# Patient Record
Sex: Male | Born: 1990
Health system: Southern US, Community
[De-identification: ages and names within clinical notes are randomized; demographics above are authoritative.]

## PROBLEM LIST (undated history)

## (undated) DIAGNOSIS — F1910 Other psychoactive substance abuse, uncomplicated: Secondary | ICD-10-CM

## (undated) DIAGNOSIS — F840 Autistic disorder: Secondary | ICD-10-CM

## (undated) DIAGNOSIS — R4183 Borderline intellectual functioning: Secondary | ICD-10-CM

## (undated) DIAGNOSIS — F250 Schizoaffective disorder, bipolar type: Secondary | ICD-10-CM

## (undated) DIAGNOSIS — F259 Schizoaffective disorder, unspecified: Secondary | ICD-10-CM

## (undated) DIAGNOSIS — F848 Other pervasive developmental disorders: Secondary | ICD-10-CM

## (undated) DIAGNOSIS — F1010 Alcohol abuse, uncomplicated: Secondary | ICD-10-CM

## (undated) DIAGNOSIS — R569 Unspecified convulsions: Secondary | ICD-10-CM

## (undated) HISTORY — PX: CIRCUMCISION: SUR203

## (undated) HISTORY — DX: Other pervasive developmental disorders: F84.8

---
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
## 2011-04-02 LAB — RAPID URINE DRUG SCREEN, HOSP PERFORMED
Amphetamines: NOT DETECTED
Tetrahydrocannabinol: NOT DETECTED

## 2012-02-12 ENCOUNTER — Emergency Department (HOSPITAL_BASED_OUTPATIENT_CLINIC_OR_DEPARTMENT_OTHER)
Admission: EM | Admit: 2012-02-12 | Discharge: 2012-02-12 | Disposition: A | Discharge status: Home / Self Care | Payer: Medicaid Other | Attending: Emergency Medicine | Admitting: Emergency Medicine

## 2012-02-12 ENCOUNTER — Emergency Department (HOSPITAL_BASED_OUTPATIENT_CLINIC_OR_DEPARTMENT_OTHER): Payer: Medicaid Other

## 2012-02-12 ENCOUNTER — Encounter (HOSPITAL_BASED_OUTPATIENT_CLINIC_OR_DEPARTMENT_OTHER): Payer: Self-pay

## 2012-02-12 DIAGNOSIS — R221 Localized swelling, mass and lump, neck: Secondary | ICD-10-CM | POA: Insufficient documentation

## 2012-02-12 DIAGNOSIS — F840 Autistic disorder: Secondary | ICD-10-CM | POA: Insufficient documentation

## 2012-02-12 DIAGNOSIS — G40909 Epilepsy, unspecified, not intractable, without status epilepticus: Secondary | ICD-10-CM | POA: Insufficient documentation

## 2012-02-12 DIAGNOSIS — S01501A Unspecified open wound of lip, initial encounter: Secondary | ICD-10-CM | POA: Insufficient documentation

## 2012-02-12 DIAGNOSIS — R220 Localized swelling, mass and lump, head: Secondary | ICD-10-CM | POA: Insufficient documentation

## 2012-02-12 DIAGNOSIS — R569 Unspecified convulsions: Secondary | ICD-10-CM

## 2012-02-12 DIAGNOSIS — R296 Repeated falls: Secondary | ICD-10-CM | POA: Insufficient documentation

## 2012-02-12 HISTORY — DX: Autistic disorder: F84.0

## 2012-02-12 HISTORY — DX: Unspecified convulsions: R56.9

## 2012-02-12 MED ORDER — TETANUS-DIPHTHERIA TOXOIDS TD 5-2 LFU IM INJ
INJECTION | INTRAMUSCULAR | Status: AC
  Administered 2012-02-12: 0.5 mL via INTRAMUSCULAR
  Filled 2012-02-12: qty 0.5

## 2012-02-12 MED ORDER — TETANUS-DIPHTH-ACELL PERTUSSIS 5-2.5-18.5 LF-MCG/0.5 IM SUSP
0.5000 mL | Freq: Once | INTRAMUSCULAR | Status: DC
  Filled 2012-02-12: qty 0.5

## 2012-02-12 NOTE — ED Notes (Signed)
CBG PTA 97  BP 140/83 HR 72

## 2012-02-12 NOTE — ED Notes (Signed)
Witnessed seizure PTA and pt sustained a lip laceration.

## 2012-02-12 NOTE — ED Notes (Signed)
Father refused tdap for pt.  Did not want him to have the pertussis

## 2012-02-12 NOTE — ED Provider Notes (Signed)
History     CSN: [id number]  Arrival date & time 02/12/12  1226   First MD Initiated Contact with Patient 02/12/12 1254      Chief Complaint  Patient presents with  . Seizures  . Lip Laceration    (Consider location/radiation/quality/duration/timing/severity/associated sxs/prior treatment) HPI Comments: Patient with a history of autism and seizures presents with seizure that was witnessed by his brother. The seizure lasted approximately 5 minutes and he had a postictal state lasts about 15 minutes. He has a history of grand mal seizures and his last seizure was in April when that time to increase his Lamictal. The seizure was similar to his past seizures per the family. He had tonic-clonic activity of all extremities. He had fallen in to a door frame and his face in the door frame. He complains of facial pain and a laceration to his lip but denies any other injuries denies any neck or back pain. Denies any numbness or weakness of his extremities  Patient is a 21 y.o. male presenting with seizures. The history is provided by the patient.  Seizures  Pertinent negatives include no headaches, no speech difficulty, no chest pain, no cough, no nausea, no vomiting and no diarrhea.    Past Medical History  Diagnosis Date  . Autism   . Seizure     No past surgical history on file.  No family history on file.  History  Substance Use Topics  . Smoking status: Never Smoker   . Smokeless tobacco: Never Used  . Alcohol Use: No      Review of Systems  Constitutional: Negative for fever, chills, diaphoresis and fatigue.  HENT: Positive for facial swelling. Negative for congestion, rhinorrhea, sneezing and neck pain.   Eyes: Negative.   Respiratory: Negative for cough, chest tightness and shortness of breath.   Cardiovascular: Negative for chest pain and leg swelling.  Gastrointestinal: Negative for nausea, vomiting, abdominal pain, diarrhea and blood in stool.  Genitourinary: Negative  for frequency, hematuria, flank pain and difficulty urinating.  Musculoskeletal: Negative for back pain and arthralgias.  Skin: Positive for wound. Negative for rash.  Neurological: Positive for seizures. Negative for dizziness, speech difficulty, weakness, numbness and headaches.    Allergies  Review of patient's allergies indicates no known allergies.  Home Medications   Current Outpatient Rx  Name Route Sig Dispense Refill  . LAMOTRIGINE 100 MG PO TABS Oral Take 100 mg by mouth daily.      BP 124/49  Pulse 72  Temp 98 F (36.7 C) (Oral)  Resp 18  SpO2 100%  Physical Exam  Constitutional: He is oriented to person, place, and time. He appears well-developed and well-nourished.  HENT:  Head: Normocephalic and atraumatic.  Mouth/Throat: Oropharynx is clear and moist.       Once the major laceration to the lower lip the majority on the inner mucosa of the lip and extends just slightly to the outer aspect of the lip. Edges are well approximated and there is no active bleeding. Nares are intact with no septal hematomas there is some dried present in the nares TMs are clear bilaterally. Moderate swelling and tenderness over the right zygomatic arch  Eyes: Conjunctivae and EOM are normal. Pupils are equal, round, and reactive to light.  Neck: Normal range of motion. Neck supple.  Cardiovascular: Normal rate, regular rhythm and normal heart sounds.   Pulmonary/Chest: Effort normal and breath sounds normal. No respiratory distress. He has no wheezes. He has no rales. He  exhibits no tenderness.  Abdominal: Soft. Bowel sounds are normal. There is no tenderness. There is no rebound and no guarding.  Musculoskeletal: Normal range of motion. He exhibits no edema.       No pain to neck or back  Lymphadenopathy:    He has no cervical adenopathy.  Neurological: He is alert and oriented to person, place, and time. He has normal strength. No cranial nerve deficit or sensory deficit. GCS eye  subscore is 4. GCS verbal subscore is 5. GCS motor subscore is 6.  Skin: Skin is warm and dry. No rash noted.  Psychiatric: He has a normal mood and affect.    ED Course  Procedures (including critical care time) Results for orders placed during the hospital encounter of 09/08/07  URINE RAPID DRUG SCREEN (HOSP PERFORMED)      Component Value Range   Opiates NONE DETECTED     Cocaine NONE DETECTED     Benzodiazepines NONE DETECTED     Amphetamines NONE DETECTED     Tetrahydrocannabinol NONE DETECTED     Barbiturates       Value: NONE DETECTED            DRUG SCREEN FOR MEDICAL PURPOSES     ONLY.  IF CONFIRMATION IS NEEDED     FOR ANY PURPOSE, NOTIFY LAB     WITHIN 5 DAYS.   Ct Head Wo Contrast  02/12/2012  *RADIOLOGY REPORT*  Clinical Data:  Seizure.  Injury to right side of face with swelling and laceration.  CT HEAD WITHOUT CONTRAST CT MAXILLOFACIAL WITHOUT CONTRAST  Technique:  Multidetector CT imaging of the head and maxillofacial structures were performed using the standard protocol without intravenous contrast. Multiplanar CT image reconstructions of the maxillofacial structures were also generated.  Comparison:  04/14/2010  CT HEAD  Findings: The brain stem, cerebellum, cerebral peduncles, thalami, basal ganglia, basilar cisterns, and ventricular system appear unremarkable.  No intracranial hemorrhage, mass lesion, or acute infarction is identified.  Minimal chronic right sphenoid sinusitis noted.  IMPRESSION:  1.  Minimal chronic right sphenoid sinusitis.   Otherwise, no significant abnormality identified.  CT MAXILLOFACIAL  Findings:   Minimal chronic right sphenoid sinusitis is observed. Remaining paranasal sinuses appear clear.  No facial fracture identified.  The orbits appear unremarkable. Minimal right facial soft tissue swelling noted.  IMPRESSION:  1.  Minimal right facial subcutaneous soft tissue swelling.  No underlying bony fracture observed. 2.  Mild chronic right sphenoid  sinusitis.  Original Report Authenticated By: Dellia Cloud, M.D.   Ct Maxillofacial Wo Cm  02/12/2012  *RADIOLOGY REPORT*  Clinical Data:  Seizure.  Injury to right side of face with swelling and laceration.  CT HEAD WITHOUT CONTRAST CT MAXILLOFACIAL WITHOUT CONTRAST  Technique:  Multidetector CT imaging of the head and maxillofacial structures were performed using the standard protocol without intravenous contrast. Multiplanar CT image reconstructions of the maxillofacial structures were also generated.  Comparison:  04/14/2010  CT HEAD  Findings: The brain stem, cerebellum, cerebral peduncles, thalami, basal ganglia, basilar cisterns, and ventricular system appear unremarkable.  No intracranial hemorrhage, mass lesion, or acute infarction is identified.  Minimal chronic right sphenoid sinusitis noted.  IMPRESSION:  1.  Minimal chronic right sphenoid sinusitis.   Otherwise, no significant abnormality identified.  CT MAXILLOFACIAL  Findings:   Minimal chronic right sphenoid sinusitis is observed. Remaining paranasal sinuses appear clear.  No facial fracture identified.  The orbits appear unremarkable. Minimal right facial soft tissue swelling noted.  IMPRESSION:  1.  Minimal right facial subcutaneous soft tissue swelling.  No underlying bony fracture observed. 2.  Mild chronic right sphenoid sinusitis.  Original Report Authenticated By: Dellia Cloud, M.D.      1. Seizure       MDM  Patient with no further seizure activity. No evidence of intracranial hemorrhage or facial fractures. I did discuss the findings with the father and offered to contact the patient's neurologist however is the father says that he start a got a call out to his neurologist to see if they want to make any changes to his medications and he did not want to stay any longer here in the emergency department by contact the neurologist. He states that he will handle it himself. Advised patient and his father to followup  with the neurologist as to his possible return here as needed for any worsening symptoms        Rolan Bucco, MD 02/12/12 1430

## 2012-11-04 ENCOUNTER — Telehealth: Payer: Self-pay

## 2012-11-04 NOTE — Telephone Encounter (Signed)
Douglas Levine, Douglas Levine called me back. He said that Douglas Levine had not had a seizure since August. He asked if he could resume riding his bike along a primary road to a nearby shopping center. He said that Douglas Levine was able to ride a bike safely but that he did sometimes become excited and like to ride fast, sometimes to see how fast he could go. When he did so, sometimes the excitement would trigger a seizure. I talked to Douglas Levine about my concerns about this since [name] would be along a primary road. I don't mind Douglas Levine riding a bike in a normal, safe fashion, while wearing a bike helmet, away from traffic but if he is doing things in a way that could trigger a seizure in traffic, I cannot agree to him riding his bike along a primary road. Douglas Levine agreed with this and said that he would think about what to do.

## 2012-11-04 NOTE — Telephone Encounter (Signed)
I left a message for Dad, Muhammad Vacca and asked him to return my call. TG

## 2012-11-04 NOTE — Telephone Encounter (Addendum)
Robert lvm asking for Inetta Fermo to call him so that he can discuss with her child's abilities since his last seizure was so long ago. i called him back and reached his vm. I left a message asking him to call me back so that I can obtain more information. Molly Maduro returned my call and said that he prefer to speak w Inetta Fermo but that one of the main things he would like to know if it is ok for pt to ride his bike now? I told him that Inetta Fermo was w pts and would return his call sometime later today. He expressed understanding.

## 2012-12-16 ENCOUNTER — Telehealth: Payer: Self-pay | Admitting: *Deleted

## 2012-12-16 NOTE — Telephone Encounter (Signed)
Robert the patient's dad called and stated that at 7:45 am this morning the patient had a grand mal seizure while in the shower, he fell in a sitting up position with his back against the tub, he suffered some long scratches on his back. Dad did not witness the seizure he was at work and was called by his other son, he rushed home and he says that Winter is now resting in his father's bed. Molly Maduro was unsure how long the seizure lasted his son who witnessed the seizure was not there when I called to speak with dad, the patient also has taken his regular 9:00 am medication Lamotrigine 100 mg. Molly Maduro can be reached at [phone] to discuss this matter he has questions about the patient's medication and if it should possibly be increased.     Thanks,  Belenda Cruise.

## 2012-12-16 NOTE — Telephone Encounter (Signed)
I called and spoke to Dad. He said that Nyshaun went to bed at 10 PM last night. He got up at 7:30 AM, which is 2 hrs earlier than his usual, but he had an appointment at [facility] for job interview training. While in the shower at 7:45 AM, he had a seizure and fell. His brother Reita Cliche heard him and helped him but it is not clear how long the seizure lasted. He has some scrapes on his back but was not otherwise injured. He has not missed any doses of medication. I told Dad to increase his Lamotrigine dose to Lamotrigine 100mg , 3 tablets BID. His last drug level was 7.37mcg/ml in September 2013. Lewi is due to follow up this summer and I will check with scheduler to make sure that he gets an appointment. Dad agreed with this plan.

## 2013-01-13 DIAGNOSIS — F848 Other pervasive developmental disorders: Secondary | ICD-10-CM | POA: Insufficient documentation

## 2013-01-13 DIAGNOSIS — G40309 Generalized idiopathic epilepsy and epileptic syndromes, not intractable, without status epilepticus: Secondary | ICD-10-CM | POA: Insufficient documentation

## 2013-01-13 DIAGNOSIS — Z79899 Other long term (current) drug therapy: Secondary | ICD-10-CM

## 2013-02-05 ENCOUNTER — Ambulatory Visit: Payer: Self-pay | Admitting: Pediatrics

## 2013-02-12 ENCOUNTER — Ambulatory Visit: Payer: Self-pay | Admitting: Pediatrics

## 2013-03-04 ENCOUNTER — Other Ambulatory Visit: Payer: Self-pay | Admitting: Family

## 2013-03-30 ENCOUNTER — Other Ambulatory Visit: Payer: Self-pay | Admitting: Family

## 2013-04-29 ENCOUNTER — Other Ambulatory Visit: Payer: Self-pay | Admitting: Family

## 2013-04-29 DIAGNOSIS — G40909 Epilepsy, unspecified, not intractable, without status epilepticus: Secondary | ICD-10-CM

## 2013-05-26 ENCOUNTER — Other Ambulatory Visit: Payer: Self-pay | Admitting: Neurology

## 2013-06-11 ENCOUNTER — Telehealth: Payer: Self-pay

## 2013-06-11 NOTE — Telephone Encounter (Signed)
Robert, dad, lvm stating that pt had a sz on 06/04/13. He said that it was due to pt missing his medication dose. He also stated that he made a f/u appt for 08/18/13. He said that he is faxing over a transportation form,[employer], and asked that Inetta Fermo fill it out and mail it back to him. He said that the directions would be on the cover sheet. I called Molly Maduro back and he said that he had been putting up reminders in certain areas of the house for pt to remember to take his medications. It was working out well for [employer]. Pts had a routine where he would set his alarm for 9 am every morning to wake him up. Then, he would get ready for work and take his medication. The [employer] bus would come to the house around 10-10:30 am to pick him up for work. For the past month, pt was not following the routine. He would have the [employer] bus pull in the driveway and call him. He would use this phone call as an alarm. Then, he would get up and rush to get ready for work. Dad said that he thinks pt was forgetting to take his meds bc he was rushing to get out the door. Pt would also fill his medicine box every Sunday. Dad said that he forgot to do this and ended up missing his doses from Sunday 05/31/13 -Thurs. 06/04/13. Thus, causing a sz to occur.   As for the SCAT forms, dad said that last time he had them filled out by our office was in 2011. They are only due every few years. He said that he thinks that he had to have our office and the pt's PCP at the time both fill them out bc of the Autism dx. He wants to know who that doctor was that filled it out the last time bc he can't remember. He said that he may need to see if that doctor will do it again. Pt is getting ready to go to a new PCP bc he now has Medicaid.I told him that his new PCP might be willing to fill it out and that he should speak w him about it as well. I explained that I am unsure if we would have that info or not and that our system was down so I am unable to look at this  time. He wants a call back when we can look it up. Molly Maduro can be reached at [phone].

## 2013-06-11 NOTE — Telephone Encounter (Signed)
I called dad back and let him know that we did not receive the forms yet. He faxed them again and I received them. He asked if they would be finished in 3 business days. I told him yes unless there is a problem. At that time, we will contact him.

## 2013-06-12 NOTE — Telephone Encounter (Signed)
Robert called and lvm stating that he is still waiting on the call back and will be available at any time. Please call him at [phone].

## 2013-06-12 NOTE — Telephone Encounter (Signed)
I called Dad and obtained information needed to complete form. I will mail it to him on Monday. TG

## 2013-06-12 NOTE — Telephone Encounter (Signed)
Molly Maduro called and said that he received a vm asking him to call back this morning regarding the paperwork. He said that he can go through the paperwork with the provider over the phone and help answer questions. Molly Maduro said that he will be available all day at [phone].

## 2013-06-15 ENCOUNTER — Other Ambulatory Visit: Payer: Self-pay | Admitting: Family

## 2013-08-17 ENCOUNTER — Other Ambulatory Visit: Payer: Self-pay | Admitting: Otolaryngology

## 2013-08-17 DIAGNOSIS — R599 Enlarged lymph nodes, unspecified: Secondary | ICD-10-CM

## 2013-08-18 ENCOUNTER — Ambulatory Visit (INDEPENDENT_AMBULATORY_CARE_PROVIDER_SITE_OTHER): Payer: Medicaid Other | Admitting: Pediatrics

## 2013-08-18 ENCOUNTER — Encounter: Payer: Self-pay | Admitting: Pediatrics

## 2013-08-18 VITALS — BP 120/50 | HR 72 | Ht 73.25 in | Wt 167.8 lb

## 2013-08-18 DIAGNOSIS — G40309 Generalized idiopathic epilepsy and epileptic syndromes, not intractable, without status epilepticus: Secondary | ICD-10-CM

## 2013-08-18 DIAGNOSIS — F840 Autistic disorder: Secondary | ICD-10-CM

## 2013-08-18 DIAGNOSIS — Z91199 Patient's noncompliance with other medical treatment and regimen due to unspecified reason: Secondary | ICD-10-CM

## 2013-08-18 DIAGNOSIS — Z9119 Patient's noncompliance with other medical treatment and regimen: Secondary | ICD-10-CM

## 2013-08-18 MED ORDER — LAMOTRIGINE 100 MG PO TABS: ORAL_TABLET | ORAL | Status: DC

## 2013-08-18 NOTE — Progress Notes (Signed)
Patient: Douglas Levine MRN: [id number] Sex: male DOB: 22-May-1991  Provider: Deetta Perla, MD Location of Care: [facility]  Note type: Routine return visit  History of Present Illness: Referral Source: [facility] History from: patient, [facility] chart and father was present by telephone. Chief Complaint: Epilepsy  Douglas Levine is a 23 y.o. male who returns for evaluation and management of juvenile absence epilepsy, borderline cognitive function, and autism spectrum disorder.  The patient returns August 18, 2013, for the first time since July 16, 2012.  He has juvenile absence epilepsy, borderline cognitive function.  Since his last visit in January, he had a seizure on the morning of December 16, 2012.  He fell in the shower.  Fortunately, he did not hit his head.  Fortunately, his brother was at home and contacted his father who came home.  Lamotrigine was increased to 300 mg twice daily.  The next episode happened on June 04, 2013.  The patient had been noncompliant with his medications from Sunday the 23rd through Thursday the 27th.  The patient had forgotten to fill his medication box and his father had not got the problem until the patient had a seizure.    The last episode happened on August 15, 2013, in the morning.  The patient was going to the circus and state up late the night before.  The seizure lasted for about 40 seconds and he was postictal for about four minutes.  Thereafter, he was tired.  His father discovered that the patient is sporadically taking his pills.  His work schedule forces him to be at work at 9 o'clock and he has not adjusted yet to remembering to take his morning dose and his nighttime dose on a regular basis.  I suggested that he might set an alarm.  His father felt that he would not do this in a consistent way and relying on an alarm would prove to be just as inadequate.  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  He is there between 9 a.m. and 3:45 p.m.  He takes the [employer] Bus to and from work at his home by himself for a couple of hours.  He goes to bed between 10 and 10:30.  He falls asleep fairly quickly.  Has occasional arousals at nighttime, but generally sleeps fairly soundly until 7.  He may be up as late as midnight on the weekends, but usually sleeps late.  Overall, his health has been good.  His father was "present" during the history taking by phone.  Review of Systems: 12 system review was unremarkable  Past Medical History  Diagnosis Date  . Autism   . Seizure   . Other specified pervasive developmental disorders, current or active state    Hospitalizations: no, Head Injury: no, Nervous System Infections: no, Immunizations up to date: yes Past Medical History Comments: He had MRI and EEG in [place] before the family moved to this area.  On evaluation December 10, 1995 he was noted to have a behavior disorder, and developmental delay.  He had a nonfocal neurological examination. EEG performed On May 17, 2006 showed episodes of 6-10 seconds of 2-1/2 Hz 400 V spike and slow-wave activity was regularly contoured.  He was poorly responsive during this time.  He also had frontally predominant Isolated spike and slow-wave discharges.  Background was otherwise normal.  He was initially placed on Zonegran on May 23, 2006.  Birth History Adopted in [birth year]  6 lbs. 6 oz. Infant born following an uncomplicated pregnancy except mother received no prenatal care. She will is a 22 year old primigravida.  Gestation was complicated by nausea and the first trimester.  The patient had limited use of alcohol no use of cigarettes or drugs during pregnancy.  In the nursery he had a poor suck, and jaundice treated with the phototherapy.  He remained for 5 days. The patient wanted 13-15 months, talk to 3 years and became toilet trained by about 4.   Behavior History  He is always behind socially.  He  had problems with impulsivity, episodic temper tantrums, physical aggression, and self-injurious behavior.  He also was aggressive with his sister and his mother.  Surgical History No past surgical history on file.  Family History family history includes Alcohol abuse in his father; Drug abuse in his father; Other in his mother.Mother was in special education and had learning differences.  Her twin brother was a Forensic scientist major at the [employer] of [place].  The patient's father was destructive as a child and abused recreational drugs as an adult. Family History is negative migraines, seizures, cognitive impairment, blindness, deafness, birth defects, chromosomal disorder, autism.  Social History History   Social History  . Marital Status: Single    Spouse Name: N/A    Number of Children: N/A  . Years of Education: N/A   Social History Main Topics  . Smoking status: Current Some Day Smoker -- 0.50 packs/day  . Smokeless tobacco: Never Used  . Alcohol Use: No  . Drug Use: No  . Sexual Activity: No   Other Topics Concern  . None   Social History Narrative  . None   Educational level 12th grade School Occupation: Works in [employer] at [employer].  Living with father  Hobbies/Interest: Enjoys being on his computer and playing football with his family. School comments Darrelle graduated from [employer] in 2011 with a certificate.   Current Outpatient Prescriptions on File Prior to Visit  Medication Sig Dispense Refill  . lamoTRIgine (LAMICTAL) 100 MG tablet TAKE TWO TABLETS BY MOUTH IN THE MORNING AND TAKE THREE TABLETS BY MOUTH IN THE EVENING  150 tablet  2   No current facility-administered medications on file prior to visit.   The medication list was reviewed and reconciled. All changes or newly prescribed medications were explained.  A complete medication list was provided to the patient/caregiver.  No Known Allergies  Physical Exam BP 120/50  Pulse 72  Ht  6' 1.25" (1.861 m)  Wt 167 lb 12.8 oz (76.114 kg)  BMI 21.98 kg/m2  General: alert, well developed, well nourished, in no acute distress, blond hair, blue eyes, left handed Head: normocephalic, no dysmorphic features Ears, Nose and Throat: Otoscopic: Tympanic membranes normal.  Pharynx: oropharynx is pink without exudates or tonsillar hypertrophy. Neck: supple, full range of motion, no cranial or cervical bruits Respiratory: auscultation clear Cardiovascular: no murmurs, pulses are normal Musculoskeletal: no skeletal deformities or apparent scoliosis Skin: no rashes or neurocutaneous lesions  Neurologic Exam  Mental Status: alert; oriented to person, place and year; knowledge is normal for age; language is normal Cranial Nerves: visual fields are full to double simultaneous stimuli; extraocular movements are full and conjugate; pupils are around reactive to light; funduscopic examination shows sharp disc margins with normal vessels; symmetric facial strength; midline tongue and uvula; air conduction is greater than bone conduction bilaterally. Motor: Normal strength, tone and mass; good fine motor movements; no pronator drift. Sensory:  intact responses to cold, vibration, proprioception and stereognosis Coordination: good finger-to-nose, rapid repetitive alternating movements and finger apposition Gait and Station: normal gait and station: patient is able to walk on heels, toes and tandem without difficulty; balance is adequate; Romberg exam is negative; Gower response is negative Reflexes: symmetric and diminished bilaterally; no clonus; bilateral flexor plantar responses.  Assessment 1. Generalized convulsive epilepsy, 345.10. 2. 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 3. Autism spectrum disorder, 209.00. 4. Personal history of noncompliance of medical regimen V15.81.  Discussion Much of the discussion centered around how to improve compliance.  Father himself says that he has  problems with memory, which makes it very difficult to setup a reliable system in the home that would be consistent.  There is no one else at home reliably to help him take his morning dose.  Father is home for him to take his evening dose, but sometimes does not remind the patient to do so.  One solution would be to place him on lamotrigine XR, which would require only one dose per day.  That could take place at dinner and might insure compliance.    I spent 30 minutes of face-to-face time with the patient and more than half of it in consultation.  He will return in followup in six months.  Deetta PerlaWilliam H Aaliyha Mumford MD

## 2013-08-18 NOTE — Patient Instructions (Signed)
There is an extended release lamotrigine where Douglas Levine would only have to take his medicine once a day.  If you're interested in exploring this, please give me a call.

## 2013-08-21 ENCOUNTER — Ambulatory Visit
Admission: RE | Admit: 2013-08-21 | Discharge: 2013-08-21 | Disposition: A | Discharge status: Home / Self Care | Payer: Medicaid Other | Source: Ambulatory Visit | Attending: Otolaryngology | Admitting: Otolaryngology

## 2013-08-21 DIAGNOSIS — R599 Enlarged lymph nodes, unspecified: Secondary | ICD-10-CM

## 2013-08-31 ENCOUNTER — Other Ambulatory Visit (HOSPITAL_COMMUNITY)
Admission: RE | Admit: 2013-08-31 | Discharge: 2013-08-31 | Disposition: A | Discharge status: Home / Self Care | Payer: Medicaid Other | Source: Ambulatory Visit | Attending: Otolaryngology | Admitting: Otolaryngology

## 2013-08-31 ENCOUNTER — Other Ambulatory Visit: Payer: Self-pay | Admitting: Otolaryngology

## 2013-08-31 DIAGNOSIS — R220 Localized swelling, mass and lump, head: Secondary | ICD-10-CM | POA: Insufficient documentation

## 2013-08-31 DIAGNOSIS — R221 Localized swelling, mass and lump, neck: Principal | ICD-10-CM

## 2013-09-10 ENCOUNTER — Other Ambulatory Visit: Payer: Self-pay | Admitting: Otolaryngology

## 2013-09-10 DIAGNOSIS — R590 Localized enlarged lymph nodes: Secondary | ICD-10-CM

## 2013-09-17 ENCOUNTER — Ambulatory Visit
Admission: RE | Admit: 2013-09-17 | Discharge: 2013-09-17 | Disposition: A | Discharge status: Home / Self Care | Payer: Medicaid Other | Source: Ambulatory Visit | Attending: Otolaryngology | Admitting: Otolaryngology

## 2013-09-17 DIAGNOSIS — R590 Localized enlarged lymph nodes: Secondary | ICD-10-CM

## 2013-09-17 MED ORDER — IOHEXOL 300 MG/ML  SOLN
75.0000 mL | Freq: Once | INTRAMUSCULAR | Status: AC | PRN
  Administered 2013-09-17: 75 mL via INTRAVENOUS

## 2013-09-18 ENCOUNTER — Other Ambulatory Visit (HOSPITAL_COMMUNITY): Payer: Self-pay | Admitting: Otolaryngology

## 2013-09-18 DIAGNOSIS — R590 Localized enlarged lymph nodes: Secondary | ICD-10-CM

## 2013-09-21 ENCOUNTER — Encounter (HOSPITAL_COMMUNITY): Payer: Self-pay | Admitting: Pharmacy Technician

## 2013-09-22 ENCOUNTER — Other Ambulatory Visit: Payer: Self-pay | Admitting: Radiology

## 2013-09-24 ENCOUNTER — Ambulatory Visit (HOSPITAL_COMMUNITY)
Admission: RE | Admit: 2013-09-24 | Discharge: 2013-09-24 | Disposition: A | Discharge status: Home / Self Care | Payer: Medicaid Other | Source: Ambulatory Visit | Attending: Otolaryngology | Admitting: Otolaryngology

## 2013-09-24 ENCOUNTER — Encounter (HOSPITAL_COMMUNITY): Payer: Self-pay

## 2013-09-24 DIAGNOSIS — R599 Enlarged lymph nodes, unspecified: Secondary | ICD-10-CM | POA: Insufficient documentation

## 2013-09-24 DIAGNOSIS — R590 Localized enlarged lymph nodes: Secondary | ICD-10-CM

## 2013-09-24 LAB — CBC
HEMATOCRIT: 45.1 % (ref 39.0–52.0)
HEMOGLOBIN: 15.8 g/dL (ref 13.0–17.0)
MCH: 30.6 pg (ref 26.0–34.0)
MCHC: 35 g/dL (ref 30.0–36.0)
MCV: 87.4 fL (ref 78.0–100.0)
Platelets: 160 10*3/uL (ref 150–400)
RBC: 5.16 MIL/uL (ref 4.22–5.81)
RDW: 12.8 % (ref 11.5–15.5)
WBC: 6.3 10*3/uL (ref 4.0–10.5)

## 2013-09-24 LAB — APTT: APTT: 33 s (ref 24–37)

## 2013-09-24 LAB — PROTIME-INR
INR: 0.99 (ref 0.00–1.49)
Prothrombin Time: 12.9 seconds (ref 11.6–15.2)

## 2013-09-24 MED ORDER — MIDAZOLAM HCL 2 MG/2ML IJ SOLN
INTRAMUSCULAR | Status: AC | PRN
  Administered 2013-09-24: 1 mg via INTRAVENOUS

## 2013-09-24 MED ORDER — FENTANYL CITRATE 0.05 MG/ML IJ SOLN
INTRAMUSCULAR | Status: AC
  Filled 2013-09-24: qty 4

## 2013-09-24 MED ORDER — SODIUM CHLORIDE 0.9 % IV SOLN: Freq: Once | INTRAVENOUS | Status: DC

## 2013-09-24 MED ORDER — MIDAZOLAM HCL 2 MG/2ML IJ SOLN
INTRAMUSCULAR | Status: AC
  Filled 2013-09-24: qty 4

## 2013-09-24 MED ORDER — FENTANYL CITRATE 0.05 MG/ML IJ SOLN
INTRAMUSCULAR | Status: AC | PRN
  Administered 2013-09-24: 25 ug via INTRAVENOUS

## 2013-09-24 NOTE — Procedures (Signed)
[address] Bx FNA No comp

## 2013-09-24 NOTE — Discharge Instructions (Signed)
Needle Biopsy °Care After °These instructions give you information on caring for yourself after your procedure. Your doctor may also give you more specific instructions. Call your doctor if you have any problems or questions after your procedure. °HOME CARE °· Rest for 4 hours after your biopsy, except for getting up to go to the bathroom or as told. °· Keep the places where the needles were put in clean and dry. °· Do not put powder or lotion on the sites. °· Do not shower until 24 hours after the test. Remove all bandages (dressings) before showering. °· Remove all bandages at least once every day. Gently clean the sites with soap and water. Keep putting a new bandage on until the skin is closed. °Finding out the results of your test °Ask your doctor when your test results will be ready. Make sure you follow up and get the test results. °GET HELP RIGHT AWAY IF:  °· You have shortness of breath or trouble breathing. °· You have pain or cramping in your belly (abdomen). °· You feel sick to your stomach (nauseous) or throw up (vomit). °· Any of the places where the needles were put in: °· Are puffy (swollen) or red. °· Are sore or hot to the touch. °· Are draining yellowish-white fluid (pus). °· Are bleeding after 10 minutes of pressing down on the site. Have someone keep pressing on any place that is bleeding until you see a doctor. °· You have any unusual pain that will not stop. °· You have a fever. °If you go to the emergency room, tell the nurse that you had a biopsy. Take this paper with you to show the nurse. °MAKE SURE YOU:  °· Understand these instructions. °· Will watch your condition. °· Will get help right away if you are not doing well or get worse. °Document Released: 06/07/2008 Document Revised: 09/17/2011 Document Reviewed: 06/07/2008 °ExitCare® Patient Information ©2014 [employer]. ° ° °

## 2013-09-24 NOTE — H&P (Signed)
Douglas Levine is an 23 y.o. male.   Chief Complaint: cervical lymphadenopathy x few weeks Increasing in size Pt underwent biopsy in Dr Emeline DarlingGore office but was non diagnostic He has requested Interventional Radiologist image guided biopsy of same  Pt is mildly autistic; I have spoken to father over phone.  HPI: autism; Sz disorder  Past Medical History  Diagnosis Date  . Autism   . Seizure   . Other specified pervasive developmental disorders, current or active state     History reviewed. No pertinent past surgical history.  Family History  Problem Relation Age of Onset  . Other Mother     Mother was a slow learner was in special education until 6 th grade.  . Alcohol abuse Father     atient's father had a history of alcohol and drug abuse and behavior problems.   . Drug abuse Father    Social History:  reports that he has been smoking.  He has never used smokeless tobacco. He reports that he does not drink alcohol or use illicit drugs.  Allergies: No Known Allergies   (Not in a hospital admission)  Results for orders placed during the hospital encounter of 09/24/13 (from the past 48 hour(s))  APTT     Status: None   Collection Time    09/24/13 12:53 PM      Result Value Ref Range   aPTT 33  24 - 37 seconds  CBC     Status: None   Collection Time    09/24/13 12:53 PM      Result Value Ref Range   WBC 6.3  4.0 - 10.5 K/uL   RBC 5.16  4.22 - 5.81 MIL/uL   Hemoglobin 15.8  13.0 - 17.0 g/dL   HCT 04.545.1  40.939.0 - 81.152.0 %   MCV 87.4  78.0 - 100.0 fL   MCH 30.6  26.0 - 34.0 pg   MCHC 35.0  30.0 - 36.0 g/dL   RDW 91.412.8  78.211.5 - 95.615.5 %   Platelets 160  150 - 400 K/uL  PROTIME-INR     Status: None   Collection Time    09/24/13 12:53 PM      Result Value Ref Range   Prothrombin Time 12.9  11.6 - 15.2 seconds   INR 0.99  0.00 - 1.49   No results found.  Review of Systems  Constitutional: Negative for fever.  Respiratory: Negative for shortness of breath.   Cardiovascular:  Negative for chest pain.  Gastrointestinal: Negative for nausea and vomiting.  Neurological: Negative for dizziness, weakness and headaches.  Psychiatric/Behavioral: Negative for substance abuse.       Mildly autistic    Blood pressure 111/49, pulse 73, temperature 98 F (36.7 C), temperature source Oral, resp. rate 18, height 6' 0.5" (1.842 m), weight 74.844 kg (165 lb), SpO2 100.00%. Physical Exam  Constitutional: He is oriented to person, place, and time. He appears well-nourished.  Cardiovascular: Normal rate, regular rhythm and normal heart sounds.   No murmur heard. Respiratory: Effort normal and breath sounds normal. He has no wheezes.  GI: Soft. Bowel sounds are normal. There is no tenderness.  Musculoskeletal: Normal range of motion.  Neurological: He is alert and oriented to person, place, and time.  Skin: Skin is warm and dry.  Psychiatric: He has a normal mood and affect. His behavior is normal. Judgment and thought content normal.  Consented father via phone     Assessment/Plan Cervical lymphadenopathy x weeks Increasing in  size; NT Bx in Dr Emeline Darling office was non diagnostic He has requested imaged guided bx in IR Pt and father aware of procedure benefits and risks and agreeable to proceed Consent signed and in chart Consented father via phone  Dilan Novosad A 09/24/2013, 1:45 PM

## 2013-10-28 ENCOUNTER — Telehealth: Payer: Self-pay

## 2013-10-28 DIAGNOSIS — Z79899 Other long term (current) drug therapy: Secondary | ICD-10-CM

## 2013-10-28 DIAGNOSIS — G40309 Generalized idiopathic epilepsy and epileptic syndromes, not intractable, without status epilepticus: Secondary | ICD-10-CM

## 2013-10-28 NOTE — Telephone Encounter (Signed)
Douglas Levine, Douglas Levine, called and said Douglas Levine had an unwitnessed sz on the morning of 10/24/13. No injuries were noted. Douglas Levine said that child was up until 3:30 am the morning of the sz. Douglas Levine said that he was unaware that Douglas Levine had been up late, and woke Douglas Levine up at 9 am on 10/24/13. Douglas Levine said that pt also missed his pm med on 10/23/13. Pt takes Lamotrigine 100 mg tabs 3 po BID. I confirmed pharmacy as [facility].  I asked the date of last sz and he told me to look it up. I saw a phone note from December 2014 of a reported sz in November. I explained the last time that he called about sz was in Dec. He started to get angry and told me that pt has had a sz after that and that I needed to get my information straight. He said " I knew you people would screw it up. I should have wrote it down myself". I told him to let me look a little further into it, and told him that pt was seen in the office in Feb for a f/u visit, at which time Douglas Levine reported sz from 08/15/13. He said "that sounds more like it".  Douglas Levine said that it has been awhile since labs were drawn. I asked what lab pt uses and Douglas Levine refused to tell me. He said that it was in the chart and to look it up. He is aware that we have switched computer systems, learned of this on last visit. He said that he is worried that we will lose pt's info and asked if we put pt's old info into our new system. I explained that it had been abstracted and that we will not lose access to the old system either. He said that we need to have a log on chronological order of his son's seizures. I told him that the info is in chronological order. Please call Douglas Levine to discuss sz and treatment plan [phone].

## 2013-10-28 NOTE — Telephone Encounter (Signed)
I called and talked with Dad. I explained that sleep deprivation and missed medication likely triggered seizure, and explained why that it was important for Douglas Levine to not miss doses and stay on regular sleep schedule. Dad said that he has not missed meds since then. I told him that he should have Lamotrigine level drawn at end of this week or early next week and Dad agreed.  We could consider Lamotrigine XR since he has problems forgetting AM dose but need to find out what current level is. I will fax lab order to [facility] on [address] and explained to Dad about trough level. He agreed with this plan. TG

## 2013-10-28 NOTE — Telephone Encounter (Signed)
I reviewed your note and agree with this plan.  I would not likely make changes in his treatment unless he were to switch to extended release lamotrigine.

## 2013-11-01 LAB — LAMOTRIGINE LEVEL: Lamotrigine Lvl: 6.4 ug/mL (ref 4.0–18.0)

## 2013-11-02 ENCOUNTER — Telehealth: Payer: Self-pay | Admitting: Pediatrics

## 2013-11-02 NOTE — Telephone Encounter (Signed)
I spoke with father today.  I told him that I would not make changes in the dose.  His drug level is 6.4 mcg/mL which means we can increase the dose further safely.  At present, there is no reason to do so because he was up late and skip the dose prior to his seizure.  As long as he continues to have seizures, he's not going to build to get a driver's license.  His father had no further questions.

## 2014-01-01 ENCOUNTER — Telehealth: Payer: Self-pay

## 2014-01-01 MED ORDER — LAMOTRIGINE 100 MG PO TABS: ORAL_TABLET | ORAL | Status: DC

## 2014-01-01 NOTE — Telephone Encounter (Signed)
Molly MaduroRobert, father, lvm stating that he witnessed pt having a sz yesterday, 12/31/13, around 5 pm. Dad said that pt did take his morning medication. He believes pt may have had 1-2 other szs that day. He asked for a call back at [phone].

## 2014-01-01 NOTE — Telephone Encounter (Signed)
I called and talked with Dad. He said that yesterday Douglas Levine had a seizure that he witnessed and may have had another one prior to Dad arriving home. He said that he called Douglas Levine on his way home from work to talk with him about their plans for the evening. He said that Douglas Levine sounded different, groggy and not like himself. He said that Douglas Levine has not been working and so has had assigned chores at home that he has been doing. When Dad got home, he found that the chores were not done, which was unusual, and Douglas Levine was upstairs in his room. He called to him to come downstairs and when Douglas Levine came down, he got to the last step and had a grand mal seizure. It lasted about 45 seconds. He was not injured. When he awakened, he had no memory of the day. Dad now thinks from the looks of house and Douglas Levine behavior that he may have had at least one seizure during the day when he was at home that Dad did not see. Dad says that he has been compliant with medication and has not missed any doses. He says that the only thing that may have contributed to it was that the day before, Douglas Levine worked outside in the yard, doing some hard work with a tree that had been cut down and got really hot and tired. I told Dad that based on Dr Hickling's last note that we should increase his Lamotrigine 100mg  from 3 BID to 3+1/2 BID. Dad agreed but wants to talk to Dr Sharene SkeansHickling. I told him that Dr Sharene SkeansHickling was out of the office today and he asked for him to call him on Monday. I updated the Rx and will relay the message to Dr Sharene SkeansHickling. TG

## 2014-01-04 NOTE — Telephone Encounter (Signed)
I spoke with father for about 5 a half minutes.  The patient's been compliant with his medicine he can going to bed at 10:30 and getting up at 8:30.  He unfortunately lost his job.  He has no obvious reason why a breakthrough seizure should have occurred it appears that there may have been more than 1 on the day in question.  His for that reason his dose was increased.

## 2014-04-21 ENCOUNTER — Other Ambulatory Visit: Payer: Self-pay | Admitting: Family

## 2014-04-21 ENCOUNTER — Telehealth: Payer: Self-pay | Admitting: *Deleted

## 2014-04-21 NOTE — Telephone Encounter (Signed)
Douglas Levine, father, stated the pt had a seizure today. The pt is at the hospital getting stitches on his chin, he had a bad cut. The father said that he thinks the seizure happened because the pt stayed up late and getting up late. The father can be reached at [phone].

## 2014-04-21 NOTE — Telephone Encounter (Signed)
I left a message for father.  I will try to call tomorrow when I am able.

## 2014-04-23 NOTE — Telephone Encounter (Signed)
The patient was up until 3:30 in the morning and had to get up at 8 AM.  He had a seizure somewhere between 9 and 10.  There may have been some problem with him taking his morning dose.  Given these facts, there is no reason to change his current medication dose.  I would change his dose only if he been compliant with medication, did not sleep deprived, without fever, and without use of alcohol.  His father is in agreement with this plan.

## 2014-05-04 ENCOUNTER — Other Ambulatory Visit: Payer: Self-pay | Admitting: Family

## 2014-06-21 ENCOUNTER — Encounter: Payer: Self-pay | Admitting: Pediatrics

## 2014-06-21 ENCOUNTER — Ambulatory Visit (INDEPENDENT_AMBULATORY_CARE_PROVIDER_SITE_OTHER): Payer: Medicaid Other | Admitting: Pediatrics

## 2014-06-21 VITALS — BP 123/60 | HR 70 | Ht 73.25 in | Wt 165.4 lb

## 2014-06-21 DIAGNOSIS — G40309 Generalized idiopathic epilepsy and epileptic syndromes, not intractable, without status epilepticus: Secondary | ICD-10-CM

## 2014-06-21 DIAGNOSIS — F840 Autistic disorder: Secondary | ICD-10-CM | POA: Diagnosis not present

## 2014-06-21 DIAGNOSIS — L409 Psoriasis, unspecified: Secondary | ICD-10-CM | POA: Diagnosis not present

## 2014-06-21 MED ORDER — LAMOTRIGINE 100 MG PO TABS: ORAL_TABLET | ORAL | Status: DC
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
## 2014-08-25 ENCOUNTER — Telehealth: Payer: Self-pay | Admitting: Family

## 2014-08-25 DIAGNOSIS — G40309 Generalized idiopathic epilepsy and epileptic syndromes, not intractable, without status epilepticus: Secondary | ICD-10-CM

## 2014-08-25 DIAGNOSIS — Z79899 Other long term (current) drug therapy: Secondary | ICD-10-CM

## 2014-08-25 NOTE — Telephone Encounter (Signed)
Dad Molly MaduroRobert Andreason left message about Andris Flurryicholas Paragas. Dad said that [name] had a seizure today. He is not sure what triggered it. Dad was told that he did not stay up late or get up early. He said that he didn't drink any alcohol, and that he has been taking his medicine. Dad said that he was not seriously hurt with the seizure but has some scrapes and perhaps a black eye. Dad wants Dr Sharene SkeansHickling to call him at [phone]. TG

## 2014-08-25 NOTE — Telephone Encounter (Signed)
Douglas Levine is no longer living with his father.  He rents a room down the road from his home for $340 a month.  His SSI of $700 is gone within days of its receipt.  It is not clear that he is sleeping well, taking medication like he should although he did pick up last prescription.  His father gave me a phone #[phone].  I will attempt to call him.  He does not want to live in a group home, but that would be the safest environment for him.  I call him and we will set up laboratory study for Friday morning as a morning trough.  I explained to him that he should bring his medicine with him and not take it until his blood is drawn.  I believe that he understands.  Please fax this to [facility].

## 2014-08-26 NOTE — Telephone Encounter (Signed)
Faxed to [facility] as requested.

## 2014-11-03 ENCOUNTER — Encounter (HOSPITAL_COMMUNITY): Payer: Self-pay | Admitting: Emergency Medicine

## 2014-11-03 ENCOUNTER — Emergency Department (HOSPITAL_COMMUNITY)
Admission: EM | Admit: 2014-11-03 | Discharge: 2014-11-03 | Disposition: A | Discharge status: Home / Self Care | Payer: Medicaid Other | Attending: Emergency Medicine | Admitting: Emergency Medicine

## 2014-11-03 DIAGNOSIS — Z720 Tobacco use: Secondary | ICD-10-CM | POA: Insufficient documentation

## 2014-11-03 DIAGNOSIS — R569 Unspecified convulsions: Secondary | ICD-10-CM | POA: Diagnosis present

## 2014-11-03 DIAGNOSIS — F840 Autistic disorder: Secondary | ICD-10-CM | POA: Insufficient documentation

## 2014-11-03 DIAGNOSIS — G40909 Epilepsy, unspecified, not intractable, without status epilepticus: Secondary | ICD-10-CM | POA: Diagnosis not present

## 2014-11-03 LAB — BASIC METABOLIC PANEL
ANION GAP: 5 (ref 5–15)
BUN: 9 mg/dL (ref 6–23)
CHLORIDE: 104 mmol/L (ref 96–112)
CO2: 27 mmol/L (ref 19–32)
Calcium: 9.3 mg/dL (ref 8.4–10.5)
Creatinine, Ser: 1.14 mg/dL (ref 0.50–1.35)
GFR calc Af Amer: >90 mL/min (ref >90)
GFR calc non Af Amer: 89 mL/min — ABNORMAL LOW (ref >90)
Glucose, Bld: 94 mg/dL (ref 70–99)
POTASSIUM: 4.2 mmol/L (ref 3.5–5.1)
Sodium: 136 mmol/L (ref 135–145)

## 2014-11-03 LAB — CBC
HEMATOCRIT: 50.2 % (ref 39.0–52.0)
Hemoglobin: 17 g/dL (ref 13.0–17.0)
MCH: 30.1 pg (ref 26.0–34.0)
MCHC: 33.9 g/dL (ref 30.0–36.0)
MCV: 89 fL (ref 78.0–100.0)
PLATELETS: 164 10*3/uL (ref 150–400)
RBC: 5.64 MIL/uL (ref 4.22–5.81)
RDW: 12.6 % (ref 11.5–15.5)
WBC: 7.7 10*3/uL (ref 4.0–10.5)

## 2014-11-03 MED ORDER — LAMOTRIGINE 100 MG PO TABS: ORAL_TABLET | ORAL | Status: DC

## 2014-11-03 MED ORDER — LAMOTRIGINE 150 MG PO TABS
350.0000 mg | ORAL_TABLET | Freq: Once | ORAL | Status: AC
  Administered 2014-11-03: 350 mg via ORAL
  Filled 2014-11-03: qty 1

## 2014-11-03 NOTE — Discharge Instructions (Signed)

## 2014-11-03 NOTE — Progress Notes (Signed)
[facility] [place] [facility] is [facility] [address] [place], [place] [phone]

## 2014-11-03 NOTE — ED Notes (Signed)
Pt reports he was told he had a seizure by roomates who heard a thump in his room. Hx of seizures, taking lamictal. Last time he took seizure medication was several days ago.

## 2014-11-03 NOTE — ED Provider Notes (Signed)
CSN: [id number]     Arrival date & time 11/03/14  1320 History   First MD Initiated Contact with Patient 11/03/14 1559     Chief Complaint  Patient presents with  . Seizures     HPI  Expand All Collapse All   Pt reports he was told he had a seizure by roomates who heard a thump in his room. Hx of seizures, taking lamictal. Last time he took seizure medication was 2 days ago. Patient is out of his medication.        Past Medical History  Diagnosis Date  . Autism   . Seizure   . Other specified pervasive developmental disorders, current or active state    Past Surgical History  Procedure Laterality Date  . Circumcision  1993   Family History  Problem Relation Age of Onset  . Other Mother     Mother was a slow learner was in special education until 6 th grade.  . Alcohol abuse Father     atient's father had a history of alcohol and drug abuse and behavior problems.   . Drug abuse Father    History  Substance Use Topics  . Smoking status: Current Some Day Smoker -- 0.50 packs/day  . Smokeless tobacco: Never Used  . Alcohol Use: 0.0 oz/week    0 Standard drinks or equivalent per week     Comment: A few drinks a week    Review of Systems  All other systems reviewed and are negative.     Allergies  Review of patient's allergies indicates no known allergies.  Home Medications   Prior to Admission medications   Medication Sig Start Date End Date Taking? Authorizing Provider  lamoTRIgine (LAMICTAL) 100 MG tablet Take 3 1/2 tablets twice daily approximately 12 hours apart 11/03/14   Nelva Nayobert Cephas Revard, MD   BP 113/63 mmHg  Pulse 66  Temp(Src) 98.2 F (36.8 C) (Oral)  Resp 14  SpO2 100% Physical Exam Physical Exam  Nursing note and vitals reviewed. Constitutional: He is oriented to person, place, and time. He appears well-developed and well-nourished. No distress.  HENT:  Head: Normocephalic and atraumatic.  Eyes: Pupils are equal, round, and reactive to light.   Neck: Normal range of motion.  Cardiovascular: Normal rate and intact distal pulses.   Pulmonary/Chest: No respiratory distress.  Abdominal: Normal appearance. He exhibits no distension.  Musculoskeletal: Normal range of motion.  Neurological: He is alert and oriented to person, place, and time. No cranial nerve deficit.Glasgow Coma Scale = 16.  Normal gait.  Normal strength.  Normal neurologic exam.  Skin: Skin is warm and dry. No rash noted.  Psychiatric: He has a normal mood and affect. His behavior is normal.   ED Course  Procedures (including critical care time)  Medications  lamoTRIgine (LAMICTAL) tablet 350 mg (350 mg Oral Given 11/03/14 1658)    Labs Review Labs Reviewed  BASIC METABOLIC PANEL - Abnormal; Notable for the following:    GFR calc non Af Amer 89 (*)    All other components within normal limits  CBC    Imaging Review No results found.    MDM   Final diagnoses:  Seizure        Nelva Nayobert Loucille Takach, MD 11/03/14 [phone]

## 2015-01-22 ENCOUNTER — Encounter (HOSPITAL_COMMUNITY): Payer: Self-pay | Admitting: *Deleted

## 2015-01-22 ENCOUNTER — Emergency Department (HOSPITAL_COMMUNITY)
Admission: EM | Admit: 2015-01-22 | Discharge: 2015-01-22 | Disposition: A | Discharge status: Home / Self Care | Payer: Medicaid Other | Attending: Emergency Medicine | Admitting: Emergency Medicine

## 2015-01-22 ENCOUNTER — Emergency Department (HOSPITAL_COMMUNITY): Payer: Medicaid Other

## 2015-01-22 DIAGNOSIS — Z23 Encounter for immunization: Secondary | ICD-10-CM | POA: Diagnosis not present

## 2015-01-22 DIAGNOSIS — Z720 Tobacco use: Secondary | ICD-10-CM | POA: Insufficient documentation

## 2015-01-22 DIAGNOSIS — G40909 Epilepsy, unspecified, not intractable, without status epilepticus: Secondary | ICD-10-CM | POA: Diagnosis not present

## 2015-01-22 DIAGNOSIS — S8992XA Unspecified injury of left lower leg, initial encounter: Secondary | ICD-10-CM | POA: Diagnosis present

## 2015-01-22 DIAGNOSIS — S0083XA Contusion of other part of head, initial encounter: Secondary | ICD-10-CM | POA: Diagnosis not present

## 2015-01-22 DIAGNOSIS — Z79899 Other long term (current) drug therapy: Secondary | ICD-10-CM | POA: Insufficient documentation

## 2015-01-22 DIAGNOSIS — S80212A Abrasion, left knee, initial encounter: Secondary | ICD-10-CM | POA: Diagnosis not present

## 2015-01-22 DIAGNOSIS — Y998 Other external cause status: Secondary | ICD-10-CM | POA: Insufficient documentation

## 2015-01-22 DIAGNOSIS — Y9389 Activity, other specified: Secondary | ICD-10-CM | POA: Diagnosis not present

## 2015-01-22 DIAGNOSIS — Y92410 Unspecified street and highway as the place of occurrence of the external cause: Secondary | ICD-10-CM | POA: Insufficient documentation

## 2015-01-22 DIAGNOSIS — R569 Unspecified convulsions: Secondary | ICD-10-CM

## 2015-01-22 DIAGNOSIS — S0093XA Contusion of unspecified part of head, initial encounter: Secondary | ICD-10-CM

## 2015-01-22 DIAGNOSIS — S0012XA Contusion of left eyelid and periocular area, initial encounter: Secondary | ICD-10-CM | POA: Insufficient documentation

## 2015-01-22 DIAGNOSIS — F840 Autistic disorder: Secondary | ICD-10-CM | POA: Diagnosis not present

## 2015-01-22 DIAGNOSIS — X58XXXA Exposure to other specified factors, initial encounter: Secondary | ICD-10-CM | POA: Insufficient documentation

## 2015-01-22 LAB — I-STAT CHEM 8, ED
BUN: 6 mg/dL (ref 6–20)
Calcium, Ion: 1.15 mmol/L (ref 1.12–1.23)
Chloride: 101 mmol/L (ref 101–111)
Creatinine, Ser: 0.9 mg/dL (ref 0.61–1.24)
Glucose, Bld: 95 mg/dL (ref 65–99)
HEMATOCRIT: 49 % (ref 39.0–52.0)
Hemoglobin: 16.7 g/dL (ref 13.0–17.0)
POTASSIUM: 4.3 mmol/L (ref 3.5–5.1)
SODIUM: 139 mmol/L (ref 135–145)
TCO2: 23 mmol/L (ref 0–100)

## 2015-01-22 MED ORDER — LAMOTRIGINE 150 MG PO TABS
350.0000 mg | ORAL_TABLET | Freq: Once | ORAL | Status: AC
  Administered 2015-01-22: 350 mg via ORAL
  Filled 2015-01-22: qty 1

## 2015-01-22 MED ORDER — IBUPROFEN 200 MG PO TABS
600.0000 mg | ORAL_TABLET | Freq: Once | ORAL | Status: AC
  Administered 2015-01-22: 600 mg via ORAL
  Filled 2015-01-22: qty 3

## 2015-01-22 MED ORDER — TETANUS-DIPHTH-ACELL PERTUSSIS 5-2.5-18.5 LF-MCG/0.5 IM SUSP
0.5000 mL | Freq: Once | INTRAMUSCULAR | Status: AC
  Administered 2015-01-22: 0.5 mL via INTRAMUSCULAR
  Filled 2015-01-22: qty 0.5

## 2015-01-22 NOTE — ED Provider Notes (Signed)
CSN: [id number]     Arrival date & time 01/22/15  0808 History   First MD Initiated Contact with Patient 01/22/15 0809     No chief complaint on file.     The history is provided by the patient. No language interpreter was used.   Mr. Douglas Levine presents for evaluation of altered mental status.  Level V caveat due to confusion.  Per EMS report pt was found on the grass beside a road when a bystander called.  He was very confused upon EMS arrival and only oriented to self.  He is unaware what happened or how he got there but reports that he was drinking alcohol last night and has a seizure disorder.  He takes lamictal regularly, did not take his dose this morning.  He reports pain at his left eye and left knee, not sure what happened to them.  Denies recent illness, fevers, vomiting, abdominal pain, chest pain, vision changes, numbness, weakness, gait difficulties.    Past Medical History  Diagnosis Date  . Autism   . Seizure   . Other specified pervasive developmental disorders, current or active state    Past Surgical History  Procedure Laterality Date  . Circumcision  1993   Family History  Problem Relation Age of Onset  . Other Mother     Mother was a slow learner was in special education until 6 th grade.  . Alcohol abuse Father     atient's father had a history of alcohol and drug abuse and behavior problems.   . Drug abuse Father    History  Substance Use Topics  . Smoking status: Current Some Day Smoker -- 0.50 packs/day  . Smokeless tobacco: Never Used  . Alcohol Use: 0.0 oz/week    0 Standard drinks or equivalent per week     Comment: A few drinks a week    Review of Systems  All other systems reviewed and are negative.     Allergies  Review of patient's allergies indicates no known allergies.  Home Medications   Prior to Admission medications   Medication Sig Start Date End Date Taking? Authorizing Provider  calcipotriene (DOVONOX) 0.005 % cream Apply topically.  12/17/14 12/17/15 Yes Historical Provider, MD  lamoTRIgine (LAMICTAL) 100 MG tablet Take 3 1/2 tablets twice daily approximately 12 hours apart Patient taking differently: Take 350 mg by mouth every 12 (twelve) hours.  11/03/14   Nelva Nayobert Beaton, MD  lamoTRIgine (LAMICTAL) 100 MG tablet Take 350 mg by mouth. 12/17/14   Historical Provider, MD   BP 117/66 mmHg  Pulse 72  Temp(Src) 97.9 F (36.6 C) (Oral)  Resp 20  SpO2 99% Physical Exam  Constitutional: He is oriented to person, place, and time. He appears well-developed and well-nourished. No distress.  HENT:  Head: Normocephalic.  Left periorbital ecchymosis and swelling.    Eyes: EOM are normal. Pupils are equal, round, and reactive to light.  Neck: Neck supple.  No c spine tenderness with painless ROM of the neck.    Cardiovascular: Normal rate and regular rhythm.   No murmur heard. Pulmonary/Chest: Effort normal and breath sounds normal. No respiratory distress. He exhibits no tenderness.  Abdominal: Soft. There is no tenderness. There is no rebound and no guarding.  Musculoskeletal: He exhibits no edema or tenderness.  Abrasion over left knee without appreciable swelling, minimal local tenderness  Neurological: He is alert and oriented to person, place, and time.  5/5 strength in all four extremities, sensation to light touch  intact in all four extremities.   Skin: Skin is warm and dry.  Multiple patches and plaques on hands and legs (pt states this is his psoriasis).  Psychiatric: He has a normal mood and affect. His behavior is normal.  Nursing note and vitals reviewed.   ED Course  Procedures (including critical care time) Labs Review Labs Reviewed  I-STAT CHEM 8, ED    Imaging Review Ct Head Wo Contrast  01/22/2015   CLINICAL DATA:  24 year old male with a history of prior seizures, loss of consciousness, alcohol use.  EXAM: CT HEAD WITHOUT CONTRAST  TECHNIQUE: Contiguous axial images were obtained from the base of the  skull through the vertex without intravenous contrast.  COMPARISON:  Multiple prior CT. Most recent 02/12/2012 and 09/17/2013  FINDINGS: Unremarkable appearance of the calvarium without acute fracture or aggressive lesion.  Unremarkable appearance of the scalp soft tissues.  Unremarkable appearance of the bilateral orbits.  Mastoid air cells are clear.  No significant paranasal sinus disease  No acute intracranial hemorrhage, midline shift, or mass effect.  Gray-white differentiation is maintained, without CT evidence of acute ischemia.  Unremarkable configuration of the ventricles.  IMPRESSION: No CT evidence of acute intracranial abnormality.  Signed,  Yvone Neu. Loreta Ave, DO  [facility]  [facility]   Electronically Signed   By: Gilmer Mor D.O.   On: 01/22/2015 08:49     EKG Interpretation None      MDM   Final diagnoses:  Seizure  Head contusion, initial encounter  Knee abrasion, left, initial encounter   Pt with hx/o autism, seizure disorder here after being found on the side of the road.  Pt is at neurologic baseline in ED, able to ambulate without difficulty, not clinically intoxicated.  Discussed home care for seizure disorder - recommend abstaining from alcohol (no evidence of DTs, pt is only an occasional drinker), continuing lamictal, outpatient followup.  Discussed wound care for knee abrasion, tetanus status was unknown - updated in ED.      Tilden Fossa, MD 01/22/15 [phone]

## 2015-01-22 NOTE — ED Notes (Signed)
Patient was found laying on the street by EMS after bystander called. Patient seemed to be confused when EMS arrived only oriented to self. Upon arrival to ER is now alert and oriented but does not know what happened other than he was drinking alcohol with friends last night. Patient does have history of seizures.

## 2015-01-22 NOTE — Discharge Instructions (Signed)
Continue to take your medications as prescribed.  Avoid drinking alcohol since this can increase your risk of seizures.   Epilepsy Epilepsy is a disorder in which a person has repeated seizures over time. A seizure is a release of abnormal electrical activity in the brain. Seizures can cause a change in attention, behavior, or the ability to remain awake and alert (altered mental status). Seizures often involve uncontrollable shaking (convulsions).  Most people with epilepsy lead normal lives. However, people with epilepsy are at an increased risk of falls, accidents, and injuries. Therefore, it is important to begin treatment right away. CAUSES  Epilepsy has many possible causes. Anything that disturbs the normal pattern of brain cell activity can lead to seizures. This may include:   Head injury.  Birth trauma.  High fever as a child.  Stroke.  Bleeding into or around the brain.  Certain drugs.  Prolonged low oxygen, such as what occurs after CPR efforts.  Abnormal brain development.  Certain illnesses, such as meningitis, encephalitis (brain infection), malaria, and other infections.  An imbalance of nerve signaling chemicals (neurotransmitters).  SIGNS AND SYMPTOMS  The symptoms of a seizure can vary greatly from one person to another. Right before a seizure, you may have a warning (aura) that a seizure is about to occur. An aura may include the following symptoms:  Fear or anxiety.  Nausea.  Feeling like the room is spinning (vertigo).  Vision changes, such as seeing flashing lights or spots. Common symptoms during a seizure include:  Abnormal sensations, such as an abnormal smell or a bitter taste in the mouth.   Sudden, general body stiffness.   Convulsions that involve rhythmic jerking of the face, arm, or leg on one or both sides.   Sudden change in consciousness.   Appearing to be awake but not responding.   Appearing to be asleep but cannot be  awakened.   Grimacing, chewing, lip smacking, drooling, tongue biting, or loss of bowel or bladder control. After a seizure, you may feel sleepy for a while. DIAGNOSIS  Your health care provider will ask about your symptoms and take a medical history. Descriptions from any witnesses to your seizures will be very helpful in the diagnosis. A physical exam, including a detailed neurological exam, is necessary. Various tests may be done, such as:   An electroencephalogram (EEG). This is a painless test of your brain waves. In this test, a diagram is created of your brain waves. These diagrams can be interpreted by a specialist.  An MRI of the brain.   A CT scan of the brain.   A spinal tap (lumbar puncture, LP).  Blood tests to check for signs of infection or abnormal blood chemistry. TREATMENT  There is no cure for epilepsy, but it is generally treatable. Once epilepsy is diagnosed, it is important to begin treatment as soon as possible. For most people with epilepsy, seizures can be controlled with medicines. The following may also be used:  A pacemaker for the brain (vagus nerve stimulator) can be used for people with seizures that are not well controlled by medicine.  Surgery on the brain. For some people, epilepsy eventually goes away. HOME CARE INSTRUCTIONS   Follow your health care provider's recommendations on driving and safety in normal activities.  Get enough rest. Lack of sleep can cause seizures.  Only take over-the-counter or prescription medicines as directed by your health care provider. Take any prescribed medicine exactly as directed.  Avoid any known triggers of  your seizures.  Keep a seizure diary. Record what you recall about any seizure, especially any possible trigger.   Make sure the people you live and work with know that you are prone to seizures. They should receive instructions on how to help you. In general, a witness to a seizure should:   Cushion  your head and body.   Turn you on your side.   Avoid unnecessarily restraining you.   Not place anything inside your mouth.   Call for emergency medical help if there is any question about what has occurred.   Follow up with your health care provider as directed. You may need regular blood tests to monitor the levels of your medicine.  SEEK MEDICAL CARE IF:   You develop signs of infection or other illness. This might increase the risk of a seizure.   You seem to be having more frequent seizures.   Your seizure pattern is changing.  SEEK IMMEDIATE MEDICAL CARE IF:   You have a seizure that does not stop after a few moments.   You have a seizure that causes any difficulty in breathing.   You have a seizure that results in a very severe headache.   You have a seizure that leaves you with the inability to speak or use a part of your body.  Document Released: 06/25/2005 Document Revised: 04/15/2013 Document Reviewed: 02/04/2013 [facility] Patient Information 2015 [place], [place]. This information is not intended to replace advice given to you by your health care provider. Make sure you discuss any questions you have with your health care provider.  Facial or Scalp Contusion A facial or scalp contusion is a deep bruise on the face or head. Injuries to the face and head generally cause a lot of swelling, especially around the eyes. Contusions are the result of an injury that caused bleeding under the skin. The contusion may turn blue, purple, or yellow. Minor injuries will give you a painless contusion, but more severe contusions may stay painful and swollen for a few weeks.  CAUSES  A facial or scalp contusion is caused by a blunt injury or trauma to the face or head area.  SIGNS AND SYMPTOMS   Swelling of the injured area.   Discoloration of the injured area.   Tenderness, soreness, or pain in the injured area.  DIAGNOSIS  The diagnosis can be made by taking a  medical history and doing a physical exam. An X-ray exam, CT scan, or MRI may be needed to determine if there are any associated injuries, such as broken bones (fractures). TREATMENT  Often, the best treatment for a facial or scalp contusion is applying cold compresses to the injured area. Over-the-counter medicines may also be recommended for pain control.  HOME CARE INSTRUCTIONS   Only take over-the-counter or prescription medicines as directed by your health care provider.   Apply ice to the injured area.   Put ice in a plastic bag.   Place a towel between your skin and the bag.   Leave the ice on for 20 minutes, 2-3 times a day.  SEEK MEDICAL CARE IF:  You have bite problems.   You have pain with chewing.   You are concerned about facial defects. SEEK IMMEDIATE MEDICAL CARE IF:  You have severe pain or a headache that is not relieved by medicine.   You have unusual sleepiness, confusion, or personality changes.   You throw up (vomit).   You have a persistent nosebleed.   You  have double vision or blurred vision.   You have fluid drainage from your nose or ear.   You have difficulty walking or using your arms or legs.  MAKE SURE YOU:   Understand these instructions.  Will watch your condition.  Will get help right away if you are not doing well or get worse. Document Released: 08/02/2004 Document Revised: 04/15/2013 Document Reviewed: 02/05/2013 [facility] Patient Information 2015 [place]. This information is not intended to replace advice given to you by your health care provider. Make sure you discuss any questions you have with your health care provider.

## 2015-01-22 NOTE — ED Notes (Signed)
Patient transported to CT 

## 2015-01-22 NOTE — ED Notes (Signed)
Bed: [id number] Expected date: 01/22/15 Expected time: 8:00 AM Means of arrival: Ambulance Comments: Found on ground seizure vs ETOH

## 2015-02-09 ENCOUNTER — Ambulatory Visit (INDEPENDENT_AMBULATORY_CARE_PROVIDER_SITE_OTHER): Payer: Medicaid Other | Admitting: Pediatrics

## 2015-02-09 ENCOUNTER — Encounter: Payer: Self-pay | Admitting: Pediatrics

## 2015-02-09 VITALS — BP 117/70 | HR 60 | Ht 73.25 in | Wt 177.4 lb

## 2015-02-09 DIAGNOSIS — F840 Autistic disorder: Secondary | ICD-10-CM | POA: Diagnosis not present

## 2015-02-09 DIAGNOSIS — G40309 Generalized idiopathic epilepsy and epileptic syndromes, not intractable, without status epilepticus: Secondary | ICD-10-CM | POA: Diagnosis not present

## 2015-02-09 MED ORDER — LAMOTRIGINE 100 MG PO TABS: ORAL_TABLET | ORAL | Status: DC

## 2015-02-09 NOTE — Progress Notes (Signed)
Patient: Douglas Levine MRN: [id number] Sex: male DOB: [birth year]  Provider: Deetta Perla, MD Location of Care: [facility]  Note type: Routine return visit  History of Present Illness: Referral Source: [facility]  History from: patient and Douglas Levine chart Chief Complaint: Epilepsy/Autism Spectrum Disorder   Douglas Levine is a 24 y.o. male who was evaluated on September 09, 2014 for the first time since June 21, 2014.  He has juvenile absence epilepsy, borderline intellectual disability, and autism spectrum disorder with preserved language.  Since his last visit, he moved out of his father's home.  We became aware of this when he suffered a seizure on August 25, 2014.  It was unclear at the time if this was an issue of lack of sleep, compliance with medication or alcohol-related.  He told his father that he was not drinking alcohol and had been taking his medicine.  We ordered a lamotrigine level which was never performed.  He returned to the emergency room on November 03, 2014 when his roommates' heard thumping on the wall coming from his room.  He was found to have a generalized convulsive seizure.  He was transported to the emergency department where he said he had not taken his medicine for two days because he ran out it.  His next emergency department visit was in January 22, 2015.  He was found down in the road.  He had drunk alcohol the night before and had forgotten his morning dose of lamotrigine.  In July, he bit his tongue.   He returns today telling me that his medicine does not work and he wants to try a new medicine.  It is clear from the information we have that just as previously they are three things that can cause his seizure threshold to lower: the first is sleep deprivation, the second is alcohol use which is often accompanied by lack of sleep, and the third is failing to take his medicine compliantly which also can, as a result of drinking  but can occur when he runs out of his medication and cannot replace it.  He receives $700 a month of SSI; $340 of which goes to rent.  His general health is good other than psoriasis.  He sleeps 8 to 10 hours at night.  He has good appetite.  His weight has gone up about 12 pounds since he was seen, but he does not look fat.  Review of Systems: 12 system review was remarkable for seizures and psoriasis  Past Medical History Diagnosis Date  . Autism   . Seizure   . Other specified pervasive developmental disorders, current or active state    Hospitalizations: No., Head Injury: No., Nervous System Infections: No., Immunizations up to date: Yes.     He had MRI and EEG in [place] before the family moved to this area.  On evaluation December 10, 1995 he was noted to have a behavior disorder, and developmental delay. He had a nonfocal neurological examination. EEG performed On May 17, 2006 showed episodes of 6-10 seconds of 2-1/2 Hz 400 V spike and slow-wave activity was regularly contoured. He was poorly responsive during this time. He also had frontally predominant Isolated spike and slow-wave discharges. Background was otherwise normal.  He was initially placed on Zonegran on May 23, 2006.  ER visit around October of 2015 due to seizure activity.  Abem' emerging health problem has been psoriasis on his arms. He has been to see a dermatologist,  who has tried a variety of topical agents. It may be necessary to use immune therapy, which carries its own risks.  ER visits due to seizure activity.  Birth History Adopted in 1993 6 lbs. 6 oz. Infant born following an uncomplicated pregnancy except mother received no prenatal care. She will is a 23 year old primigravida. Gestation was complicated by nausea and the first trimester. The patient had limited use of alcohol no use of cigarettes or drugs during pregnancy.  In the nursery he had a poor suck, and jaundice treated with the  phototherapy. He remained for 5 days. The patient wanted 13-15 months, talk to 3 years and became toilet trained by about 4.  Behavior History He is always behind socially. He had problems with impulsivity, episodic temper tantrums, physical aggression, and self-injurious behavior. He also was aggressive with his sister and his mother.  Surgical History Procedure Laterality Date  . Circumcision  1993   Family History family history includes Alcohol abuse in his father; Drug abuse in his father; Other in his mother. Family history is negative for migraines, seizures, intellectual disabilities, blindness, deafness, birth defects, chromosomal disorder, or autism.  Social History . Marital Status: Single    Spouse Name: N/A  . Number of Children: N/A  . Years of Education: N/A   Social History Main Topics  . Smoking status: Current Some Day Smoker -- 0.50 packs/day  . Smokeless tobacco: Never Used  . Alcohol Use: 0.0 oz/week    0 Standard drinks or equivalent per week     Comment: A few drinks a week  . Drug Use: No  . Sexual Activity:    Partners: Female    Pharmacist, Levine Protection: Condom   Social History Narrative  Educational level 12th grade Occupation: [employer]  Living with roommates at a boarding house  Hobbies/Interest: Enjoys having a job and going to work.  No Known Allergies  Physical Exam BP 117/70 mmHg  Pulse 60  Ht 6' 1.25" (1.861 m)  Wt 177 lb 6.4 oz (80.468 kg)  BMI 23.23 kg/m2  General: alert, well developed, well nourished, in no acute distress, blond hair, blue eyes, left handed Head: normocephalic, no dysmorphic features Ears, Nose and Throat: Otoscopic: Tympanic membranes normal. Pharynx: oropharynx is pink without exudates or tonsillar hypertrophy. Neck: supple, full range of motion, no cranial or cervical bruits Respiratory: auscultation clear Cardiovascular: no murmurs, pulses are normal Musculoskeletal: no skeletal deformities or apparent  scoliosis Skin: no neurocutaneous lesions; psoriasis on his arms  Neurologic Exam  Mental Status: alert; oriented to person, place; knowledge is below normal for age; language is normal; limited eye contact; flat affect Cranial Nerves: visual fields are full to double simultaneous stimuli; extraocular movements are full and conjugate; pupils are around reactive to light; funduscopic examination shows sharp disc margins with normal vessels; symmetric facial strength; midline tongue and uvula; air conduction is greater than bone conduction bilaterally. Motor: Normal strength, tone and mass; good fine motor movements; no pronator drift. Sensory: intact responses to cold, vibration, proprioception and stereognosis Coordination: good finger-to-nose, rapid repetitive alternating movements and finger apposition Gait and Station: normal gait and station: patient is able to walk on heels, toes and tandem without difficulty; balance is adequate; Romberg exam is negative; Gower response is negative Reflexes: symmetric and diminished bilaterally; no clonus; bilateral flexor plantar responses.  Assessment 1. Generalized convulsive epilepsy, G40.309. 2. Generalized nonconvulsive epilepsy, G40.309. 3. Autism spectrum disorder with accompanying intellectual impairment, requiring support (level 1), F84.0.  Discussion It  is clear that Douglas Levine has experienced seizures as a result of behaviors that lower his seizure threshold including alcohol use, sleep deprivation, and failure to take medication either because he forgets or he runs out of it.  I explained to him that the problem was not his medication which seems to work quite well when he takes it, but his inconsistency with taking medication.  At least for the time being I think I convinced him that we need to stay with the medication that seems to work when he takes it.  Plan Douglas Levine will return to see me in six months' time.  I refilled his medication.   There is no reason to make a change in it.  I spent 30 minutes of face-to-face time with Douglas Levine, more than half of it in consultation.  I told him to call if he had recurrent seizures or if he had trouble refilling his medicine.   Medication List   This list is accurate as of: 02/09/15  4:17 PM.       calcipotriene 0.005 % cream  Commonly known as:  DOVONOX  Apply topically.     lamoTRIgine 100 MG tablet  Commonly known as:  LAMICTAL  Take 3 1/2 tablets twice daily approximately 12 hours apart      The medication list was reviewed and reconciled. All changes or newly prescribed medications were explained.  A complete medication list was provided to the patient/caregiver.  Deetta Perla MD

## 2015-02-25 ENCOUNTER — Emergency Department (HOSPITAL_COMMUNITY)
Admission: EM | Admit: 2015-02-25 | Discharge: 2015-02-25 | Disposition: A | Discharge status: Home / Self Care | Payer: Medicaid Other | Attending: Emergency Medicine | Admitting: Emergency Medicine

## 2015-02-25 ENCOUNTER — Encounter (HOSPITAL_COMMUNITY): Payer: Self-pay

## 2015-02-25 DIAGNOSIS — F840 Autistic disorder: Secondary | ICD-10-CM | POA: Insufficient documentation

## 2015-02-25 DIAGNOSIS — Z79899 Other long term (current) drug therapy: Secondary | ICD-10-CM | POA: Insufficient documentation

## 2015-02-25 DIAGNOSIS — Z720 Tobacco use: Secondary | ICD-10-CM | POA: Insufficient documentation

## 2015-02-25 DIAGNOSIS — G40909 Epilepsy, unspecified, not intractable, without status epilepticus: Secondary | ICD-10-CM

## 2015-02-25 DIAGNOSIS — R569 Unspecified convulsions: Secondary | ICD-10-CM

## 2015-02-25 HISTORY — DX: Borderline intellectual functioning: R41.83

## 2015-02-25 NOTE — Progress Notes (Signed)
pcp assigned by medicaid  access is [facility] [address] [place], [place] [address] [phone]  

## 2015-02-25 NOTE — ED Notes (Signed)
Per EMS, Pt, from [facility], c/o witnessed seizure lasting 1-2 minutes. Denies pain.  Hx of seizure and autism.  Pt reports that he is compliant w/ medications.  Pt takes lamotrigine.

## 2015-02-25 NOTE — ED Provider Notes (Signed)
CSN: [id number]     Arrival date & time 02/25/15  0913 History   First MD Initiated Contact with Patient 02/25/15 [phone]     Chief Complaint  Patient presents with  . Seizures      HPI Pt was seen at 0925. Per EMS, witnesses and pt report: c/o sudden onset and resolution of one episode of seizure that occurred PTA. Pt was sitting in a chair when he had a seizure. Denies falling. Pt has hx of seizure disorder and has been taking his Lamictal as prescribed, with LD this morning. Pt states he believes he had a seizure today because he "stayed up really late last night and got up early." Pt feels at baseline currently. Denies etoh intake, denies fevers, no N/V/D, no focal motor weakness, no tingling/numbness in extremities.    Past Medical History  Diagnosis Date  . Autism   . Seizure   . Other specified pervasive developmental disorders, current or active state   . Borderline intellectual disability    Past Surgical History  Procedure Laterality Date  . Circumcision  1993   Family History  Problem Relation Age of Onset  . Other Mother     Mother was a slow learner was in special education until 6 th grade.  . Alcohol abuse Father     atient's father had a history of alcohol and drug abuse and behavior problems.   . Drug abuse Father    Social History  Substance Use Topics  . Smoking status: Current Some Day Smoker -- 0.50 packs/day  . Smokeless tobacco: Never Used  . Alcohol Use: 0.0 oz/week    0 Standard drinks or equivalent per week     Comment: A few drinks a week    Review of Systems ROS: Statement: All systems negative except as marked or noted in the HPI; Constitutional: Negative for fever and chills. ; ; Eyes: Negative for eye pain, redness and discharge. ; ; ENMT: Negative for ear pain, hoarseness, nasal congestion, sinus pressure and sore throat. ; ; Cardiovascular: Negative for chest pain, palpitations, diaphoresis, dyspnea and peripheral edema. ; ; Respiratory:  Negative for cough, wheezing and stridor. ; ; Gastrointestinal: Negative for nausea, vomiting, diarrhea, abdominal pain, blood in stool, hematemesis, jaundice and rectal bleeding. . ; ; Genitourinary: Negative for dysuria, flank pain and hematuria. ; ; Musculoskeletal: Negative for back pain and neck pain. Negative for swelling and trauma.; ; Skin: Negative for pruritus, rash, abrasions, blisters, bruising and skin lesion.; ; Neuro: +seizure. Negative for headache, lightheadedness and neck stiffness. Negative for weakness, extremity weakness, paresthesias.      Allergies  Review of patient's allergies indicates no known allergies.  Home Medications   Prior to Admission medications   Medication Sig Start Date End Date Taking? Authorizing Provider  lamoTRIgine (LAMICTAL) 100 MG tablet Take 3 1/2 tablets twice daily approximately 12 hours apart 02/09/15  Yes Deetta Perla, MD   BP 124/65 mmHg  Pulse 85  Temp(Src) 98.3 F (36.8 C) (Oral)  Resp 18  SpO2 97% Physical Exam  0930: Physical examination:  Nursing notes reviewed; Vital signs and O2 SAT reviewed;  Constitutional: Well developed, Well nourished, Well hydrated, In no acute distress; Head:  Normocephalic, atraumatic; Eyes: EOMI, PERRL, No scleral icterus; ENMT: Mouth and pharynx normal, Mucous membranes moist; Neck: Supple, Full range of motion, No lymphadenopathy; Cardiovascular: Regular rate and rhythm, No murmur, rub, or gallop; Respiratory: Breath sounds clear & equal bilaterally, No rales, rhonchi, wheezes.  Speaking  full sentences with ease, Normal respiratory effort/excursion; Chest: Nontender, Movement normal; Abdomen: Soft, Nontender, Nondistended, Normal bowel sounds; Genitourinary: No CVA tenderness; Spine:  No midline CS, TS, LS tenderness.;; Extremities: Pulses normal, No tenderness, No edema, No calf edema or asymmetry.; Neuro: AA&Ox3, Major CN grossly intact.  Speech clear. No gross focal motor or sensory deficits in  extremities.; Skin: Color normal, Warm, Dry.   ED Course  Procedures    MDM  MDM Reviewed: nursing note, previous chart and vitals      0940:  T/C to pt's Neuro Dr. [name], case discussed, including:  HPI, pertinent PM/SHx, VS/PE, ED course:  Lack of sleep is pt's typical seizure trigger, continue med at current dose, f/u office. Dx d/w pt.  Questions answered.  Verb understanding, agreeable to d/c home with outpt f/u.    Samuel Jester, DO 02/27/15 1424

## 2015-02-25 NOTE — Discharge Instructions (Signed)
°Emergency Department Resource Guide °1) Find a Doctor and Pay Out of Pocket °Although you won't have to find out who is covered by your insurance plan, it is a good idea to ask around and get recommendations. You will then need to call the office and see if the doctor you have chosen will accept you as a new patient and what types of options they offer for patients who are self-pay. Some doctors offer discounts or will set up payment plans for their patients who do not have insurance, but you will need to ask so you aren't surprised when you get to your appointment. ° °2) Contact Your Local Health Department °Not all health departments have doctors that can see patients for sick visits, but many do, so it is worth a call to see if yours does. If you don't know where your local health department is, you can check in your phone book. The CDC also has a tool to help you locate your state's health department, and many state websites also have listings of all of their local health departments. ° °3) Find a Walk-in Clinic °If your illness is not likely to be very severe or complicated, you may want to try a walk in clinic. These are popping up all over the country in pharmacies, drugstores, and shopping centers. They're usually staffed by nurse practitioners or physician assistants that have been trained to treat common illnesses and complaints. They're usually fairly quick and inexpensive. However, if you have serious medical issues or chronic medical problems, these are probably not your best option. ° °No Primary Care Doctor: °- Call [facility] at  832-8000 - they can help you locate a primary care doctor that  accepts your insurance, provides certain services, etc. °- Physician Referral Service- 1-800-533-3463 ° °Chronic Pain Problems: °Organization         Address  Phone   Notes  °[facility]  (336) 297-2271 Patients need to be referred by their primary care doctor.  ° °Medication  Assistance: °Organization         Address  Phone   Notes  °[place] [facility] [address] °[place], [place] [address] (336) 641-8030 --Must be a resident of [place] °-- Must have NO insurance coverage whatsoever (no Medicaid/ Medicare, etc.) °-- The pt. MUST have a primary care doctor that directs their care regularly and follows them in the community °  °[facility]  (866) 331-1348   °[employer]  (888) 892-1162   ° °Agencies that provide inexpensive medical care: °Organization         Address  Phone   Notes  °[facility]  (336) 832-8035   °[facility]    (336) 832-7272   °[facility] [address] °[place], [place] [address] (336) 832-4777   °[facility] [address], °[place] (336) 271-4999   °[facility]    (336) 373-0678   °[facility]    (336) 272-1050   °[facility] ° [address], [place] Phone:  (336) 832-4444, Fax:  (336) 832-4440 Hours of Operation:  9 am - 6 pm, M-F.  Also accepts Medicaid/Medicare and self-pay.  °[facility] ° [address], [place] Phone: (336) 832-3150, Fax: (336) 832-3151. Hours of Operation:  8:30 am - 5:30 pm, M-F.  Also accepts Medicaid and self-pay.  °[facility] [place] [address], [place] Phone: (336) 878-6027   °[facility] [address], [place], [place] (336)723-1848, Ext. 123 Mondays & Thursdays: 7-9 AM.  First 15 patients are seen on a first come, first serve basis. °  ° °Medicaid-accepting [place] Providers: ° °Organization         Address  Phone   Notes  °[facility] [address], [place] (336) 641-2100 Also accepts self-pay patients.  °[facility] [address], [place] ° (336) 856-9996   °[facility] [address], [place]  (336) 288-8857   °[facility] [address] [place] [address], [place] (336) 299-7000   °Veita Bland [address], [place]  ° (336) 373-1557 Only accepts [place] Access Medicaid patients after they have their name applied to their card.  ° °Self-Pay (no insurance) in [place]: ° °Organization         Address  Phone   Notes  °Sickle Cell Patients, [facility] [address], [place] (336) 832-1970   °[facility] [address], [place] (336) 832-4400   °[facility] ° [address], [place] (336) 992-4800   °[facility]/Dr. Osei-Bonsu ° [address] [place] [address], [place] or [address], [place] (336) 841-8500 Phone number for both [place] and [place] locations is the same.  °[facility] [address], [place] (336) 299-0000   °[facility] [address] [place] [address], [place] or [address] (336) 852-7530 °(336) 878-2260   °[facility] [address], [place] (336) 350-1642, phone; (336) 294-5005, fax Sees patients 1st and 3rd Saturday of every month.  Must not qualify for public or private insurance (i.e. Medicaid, Medicare, [place] Health Choice, Veterans' Benefits) • Household income should be no more than 200% of the poverty level •The clinic cannot treat you if you are pregnant or think you are pregnant • Sexually transmitted diseases are not treated at the clinic.  ° ° °Dental Care: °Organization         Address  Phone  Notes  °[place] [facility] [address], [place] (336) 641-6152 Accepts children up to age 21 who are enrolled in Medicaid or [place] Health Choice; pregnant women with a Medicaid card; and children who have applied for Medicaid or [place] Health Choice, but were declined, whose parents can pay a reduced fee at time of service.  °[place]  [facility] [place]  [address], [place] (336) 641-7733 Accepts children up to age 21 who are enrolled in Medicaid or [place] Health Choice; pregnant women with a Medicaid card; and children who have applied for Medicaid or [place] Health Choice, but were declined, whose parents can pay a reduced fee at time of service.  °[facility] ° [address], [place] (336) 641-4533 Patients are seen by appointment only. Walk-ins are not accepted. [facility] will see patients 18 years of age and older. °Monday - Tuesday (8am-5pm) °Most Wednesdays (8:30-5pm) °$30 per visit, cash only  °[facility] ° [address], [place] (336) 641-4533 Patients are seen by appointment only. Walk-ins are not accepted. [facility] will see patients 18 years of age and older. °One   Wednesday Evening (Monthly: Volunteer Based).  $30 per visit, cash only  °[facility]  (919) 537-3737 for adults; Children under age 4, call Graduate Pediatric Dentistry at (919) [phone]. Children aged 4-14, please call (919) [phone] to request a pediatric application. ° Dental services are provided in all areas of dental care including fillings, crowns and bridges, complete and partial dentures, implants, gum treatment, root canals, and extractions. Preventive care is also provided. Treatment is provided to both adults and children. °Patients are selected via a lottery and there is often a waiting list. °  °[facility] [address], °[place] ° (336) 763-8833 [email or web address] °  °[facility] [address], [place], [place] (336)723-1848, Ext. 123 Second and Fourth Thursday of each month, opens at 6:30 AM; Clinic ends at 9 AM.  Patients are seen on a first-come first-served basis, and a limited number are seen during each clinic.  ° °[facility] ° [address], [place], [place] (336) 723-7904    Eligibility Requirements °You must have lived in [place], [place], or [place] counties for at least the last three months. °  You cannot be eligible for state or federal sponsored healthcare insurance, including Veterans Administration, Medicaid, or Medicare. °  You generally cannot be eligible for healthcare insurance through your employer.  °  How to apply: °Eligibility screenings are held every Tuesday and Wednesday afternoon from 1:00 pm until 4:00 pm. You do not need an appointment for the interview!  °[facility] [address], [place], [place] 336-631-2330   °[facility]  336-342-8273   °[facility]  336-703-3100   °[facility]  336-570-6415   ° °Behavioral Health Resources in the Community: °Intensive Outpatient Programs °Organization         Address  Phone  Notes  °[facility] [address], [place], [place] 336-878-6098   °[facility] [address], [place], [place] 336-832-9800   °[facility] [address], [place], [place] ° 336-882-2125   °[facility] [address],  °[place], [place] 1-800-853-5163 or 336-641-4981   °Substance Abuse Resources °Organization         Address  Phone  Notes  °[facility]  336-882-2125   °[facility]  336-784-9470   °[facility]  336-285-9073   °[facility]  336-845-3988   °Residential & Outpatient Substance Abuse Program  1-800-659-3381   °Psychological Services °Organization         Address  Phone  Notes  °[facility]  336- 832-9600   °[facility]  336- 378-7881   °[facility] [address], [place] 1-800-853-5163 or 336-641-4981   ° °Mobile Crisis Teams °Organization         Address  Phone  Notes  °[facility], Mobile Crisis Care Unit  1-877-626-1772   °Assertive °[facility] ° [address]  [place], [place] 336-834-9664   °Sharon DeEsch [address] °[place] 336-554-5454   ° °Self-Help/Support Groups °Organization         Address  Phone             Notes  °[facility]  - variety of support groups  336- 373-1402 Call for more information  °Narcotics Anonymous (NA), [facility] [address], °[place]  2 meetings at this location  ° °  Residential Treatment Programs Organization         Address  Phone  Notes  [facility] [address]    [place]  4-098-119-1478   [facility]  [address] [place] [address], [place], [place] 308-657-8469   [facility] [address] [place], [place] 629-528-4132 Admissions: 8am-3pm M-F  [facility] [address],    [place], [place] 440-102-7253   [facility] [address], [place], [place] 664-403-4742   [facility] [address]  [place] 595-638-7564   [facility] Intensive Outpatient [address], [place], [place] 332-951-8841   [facility] ([facility] [address] [place]  [place], [place] 6-606-301-6010 or 414-091-4299   [facility] [address] [place], [place] 025-427-0623 Accepts Medicaid  [facility] [address],  [place] 7-628-315-1761 Substance Abuse/Addiction Treatment   [facility]         Address  Phone  Notes  [facility]  [phone]   Angie Fava, PhD [address] [place], [place]   938-387-4291 or (919)534-0205   [facility]   [address] [place], [place] (807)254-7705   [facility] [address] [place], [place] 838-085-4364 Insurance/Medicaid/sponsorship through [facility] [address]                                    [place], [place] (914)483-4198 Therapy/tele-psych/case    [facility] [address], [place] [phone]    Dr. Lolly Mustache  (667)177-4050   [facility]  [facility] : [facility] Dept. 1) [address], [place] 2) [address] [place] 3)  [address], [place] (458)426-2180 863-047-1913  (367) 725-2278   [facility] Child Abuse Hotline 413-244-9518 or (336)305-4183 (After Hours)      Take your usual prescriptions as previously directed. Avoid alcohol and "staying up late," as these can be a trigger for your seizures. Call your regular Neurologist today to schedule a follow up appointment within the next week.  Return to the Emergency Department immediately sooner if worsening.

## 2015-02-25 NOTE — ED Notes (Signed)
Bed: WA24 Expected date:  Expected time:  Means of arrival:  Comments: Ems seizure 

## 2015-02-25 NOTE — ED Notes (Addendum)
Pt called a ride prior to leaving room, but insisted on waiting in the lobby.  Pt escorted to lobby by [facility] RN.

## 2015-05-11 ENCOUNTER — Encounter (HOSPITAL_COMMUNITY): Payer: Self-pay | Admitting: Emergency Medicine

## 2015-05-11 ENCOUNTER — Emergency Department (HOSPITAL_COMMUNITY)
Admission: EM | Admit: 2015-05-11 | Discharge: 2015-05-12 | Disposition: A | Discharge status: Home / Self Care | Payer: Medicaid Other | Attending: Emergency Medicine | Admitting: Emergency Medicine

## 2015-05-11 DIAGNOSIS — F840 Autistic disorder: Secondary | ICD-10-CM | POA: Diagnosis not present

## 2015-05-11 DIAGNOSIS — Y9389 Activity, other specified: Secondary | ICD-10-CM | POA: Insufficient documentation

## 2015-05-11 DIAGNOSIS — Z720 Tobacco use: Secondary | ICD-10-CM | POA: Insufficient documentation

## 2015-05-11 DIAGNOSIS — G40909 Epilepsy, unspecified, not intractable, without status epilepticus: Secondary | ICD-10-CM | POA: Diagnosis not present

## 2015-05-11 DIAGNOSIS — Y9289 Other specified places as the place of occurrence of the external cause: Secondary | ICD-10-CM | POA: Diagnosis not present

## 2015-05-11 DIAGNOSIS — S01111A Laceration without foreign body of right eyelid and periocular area, initial encounter: Secondary | ICD-10-CM | POA: Insufficient documentation

## 2015-05-11 DIAGNOSIS — W1839XA Other fall on same level, initial encounter: Secondary | ICD-10-CM | POA: Insufficient documentation

## 2015-05-11 DIAGNOSIS — R569 Unspecified convulsions: Secondary | ICD-10-CM

## 2015-05-11 DIAGNOSIS — IMO0002 Reserved for concepts with insufficient information to code with codable children: Secondary | ICD-10-CM

## 2015-05-11 DIAGNOSIS — Y998 Other external cause status: Secondary | ICD-10-CM | POA: Insufficient documentation

## 2015-05-11 DIAGNOSIS — Z79899 Other long term (current) drug therapy: Secondary | ICD-10-CM | POA: Insufficient documentation

## 2015-05-11 LAB — CBC WITH DIFFERENTIAL/PLATELET
BASOS ABS: 0.1 10*3/uL (ref 0.0–0.1)
Basophils Relative: 1 %
Eosinophils Absolute: 0.2 10*3/uL (ref 0.0–0.7)
Eosinophils Relative: 2 %
HEMATOCRIT: 47.4 % (ref 39.0–52.0)
HEMOGLOBIN: 16.4 g/dL (ref 13.0–17.0)
LYMPHS PCT: 9 %
Lymphs Abs: 1 10*3/uL (ref 0.7–4.0)
MCH: 30.5 pg (ref 26.0–34.0)
MCHC: 34.6 g/dL (ref 30.0–36.0)
MCV: 88.3 fL (ref 78.0–100.0)
MONO ABS: 0.7 10*3/uL (ref 0.1–1.0)
Monocytes Relative: 7 %
NEUTROS ABS: 9.1 10*3/uL — AB (ref 1.7–7.7)
Neutrophils Relative %: 81 %
Platelets: 144 10*3/uL — ABNORMAL LOW (ref 150–400)
RBC: 5.37 MIL/uL (ref 4.22–5.81)
RDW: 12.6 % (ref 11.5–15.5)
WBC: 11.1 10*3/uL — AB (ref 4.0–10.5)

## 2015-05-11 LAB — BASIC METABOLIC PANEL
Anion gap: 6 (ref 5–15)
BUN: 11 mg/dL (ref 6–20)
CALCIUM: 9.6 mg/dL (ref 8.9–10.3)
CHLORIDE: 106 mmol/L (ref 101–111)
CO2: 29 mmol/L (ref 22–32)
Creatinine, Ser: 0.93 mg/dL (ref 0.61–1.24)
Glucose, Bld: 101 mg/dL — ABNORMAL HIGH (ref 65–99)
POTASSIUM: 4.8 mmol/L (ref 3.5–5.1)
Sodium: 141 mmol/L (ref 135–145)

## 2015-05-11 LAB — URINALYSIS, ROUTINE W REFLEX MICROSCOPIC
Bilirubin Urine: NEGATIVE
GLUCOSE, UA: NEGATIVE mg/dL
Ketones, ur: NEGATIVE mg/dL
Leukocytes, UA: NEGATIVE
NITRITE: NEGATIVE
Protein, ur: NEGATIVE mg/dL
SPECIFIC GRAVITY, URINE: 1.018 (ref 1.005–1.030)
Urobilinogen, UA: 0.2 mg/dL (ref 0.0–1.0)
pH: 5 (ref 5.0–8.0)

## 2015-05-11 LAB — RAPID URINE DRUG SCREEN, HOSP PERFORMED
AMPHETAMINES: NOT DETECTED
BARBITURATES: NOT DETECTED
BENZODIAZEPINES: NOT DETECTED
COCAINE: NOT DETECTED
Opiates: NOT DETECTED
Tetrahydrocannabinol: NOT DETECTED

## 2015-05-11 LAB — CBG MONITORING, ED: Glucose-Capillary: 95 mg/dL (ref 65–99)

## 2015-05-11 LAB — ETHANOL: Alcohol, Ethyl (B): <5 mg/dL (ref <5)

## 2015-05-11 LAB — URINE MICROSCOPIC-ADD ON

## 2015-05-11 MED ORDER — LIDOCAINE-EPINEPHRINE (PF) 2 %-1:200000 IJ SOLN: 10.0000 mL | Freq: Once | INTRAMUSCULAR | Status: DC

## 2015-05-11 MED ORDER — LIDOCAINE-EPINEPHRINE 2 %-1:100000 IJ SOLN
INTRAMUSCULAR | Status: AC
  Administered 2015-05-11: 10 mL
  Filled 2015-05-11: qty 1

## 2015-05-11 MED ORDER — HYDROGEN PEROXIDE 3 % EX SOLN
CUTANEOUS | Status: AC
  Filled 2015-05-11: qty 473

## 2015-05-11 MED ORDER — LORAZEPAM 2 MG/ML IJ SOLN
1.0000 mg | Freq: Once | INTRAMUSCULAR | Status: AC
  Administered 2015-05-11: 1 mg via INTRAVENOUS
  Filled 2015-05-11: qty 1

## 2015-05-11 MED ORDER — LAMOTRIGINE 200 MG PO TABS
500.0000 mg | ORAL_TABLET | Freq: Once | ORAL | Status: AC
  Administered 2015-05-12: 500 mg via ORAL
  Filled 2015-05-11: qty 1

## 2015-05-11 NOTE — ED Notes (Signed)
Bed: WA02 Expected date:  Expected time:  Means of arrival:  Comments: EMS-Seizure-hit head-lac above eye

## 2015-05-11 NOTE — ED Notes (Signed)
Pt stated "I don't have the $3.00 because I have large bills.  It's not like a $10."  Informed pt pharmacy will break the large bill.  Pt stated "oh, really?"  Informed yes.

## 2015-05-11 NOTE — ED Notes (Signed)
RN dialed dad's phone from room phone.  Pt informed father he did not have the $3 to fill his meds.  Father yelling over the phone.

## 2015-05-11 NOTE — ED Provider Notes (Signed)
CSN: [id number]     Arrival date & time 05/11/15  1908 History   First MD Initiated Contact with Patient 05/11/15 1939     Chief Complaint  Patient presents with  . Seizures  . Fall  . Facial Laceration     (Consider location/radiation/quality/duration/timing/severity/associated sxs/prior Treatment) HPI  24 year old male who presents the ED via EMS for seizure and facial injury. He has a past medical history of autism and MR. The patient states that he has been out of his medication for 2 days. He denies drug or alcohol abuse. Patient states that he was outside hanging out with some friends and he does not remember anything after that slowly got to the hospital. He denies any recent illnesses.  Past Medical History  Diagnosis Date  . Autism   . Seizure (HCC)   . Other specified pervasive developmental disorders, current or active state   . Borderline intellectual disability    Past Surgical History  Procedure Laterality Date  . Circumcision  1993   Family History  Problem Relation Age of Onset  . Other Mother     Mother was a slow learner was in special education until 6 th grade.  . Alcohol abuse Father     atient's father had a history of alcohol and drug abuse and behavior problems.   . Drug abuse Father    Social History  Substance Use Topics  . Smoking status: Current Some Day Smoker -- 0.50 packs/day  . Smokeless tobacco: Never Used  . Alcohol Use: 0.0 oz/week    0 Standard drinks or equivalent per week     Comment: A few drinks a week    Review of Systems Ten systems reviewed and are negative for acute change, except as noted in the HPI.     Allergies  Review of patient's allergies indicates no known allergies.  Home Medications   Prior to Admission medications   Medication Sig Start Date End Date Taking? Authorizing Provider  lamoTRIgine (LAMICTAL) 100 MG tablet Take 3 1/2 tablets twice daily approximately 12 hours apart 02/09/15  Yes Deetta Perla,  MD   BP 126/82 mmHg  Pulse 78  Temp(Src) 98.3 F (36.8 C) (Oral)  Resp 18  SpO2 99% Physical Exam  Constitutional: He appears well-developed and well-nourished. No distress.  HENT:  Head: Normocephalic.  Eyes: Conjunctivae are normal. No scleral icterus.    Neck: Normal range of motion. Neck supple.  Cardiovascular: Normal rate, regular rhythm and normal heart sounds.   Pulmonary/Chest: Effort normal and breath sounds normal. No respiratory distress.  Abdominal: Soft. There is no tenderness.  Musculoskeletal: He exhibits no edema.  Neurological: He is alert.  Skin: Skin is warm and dry. He is not diaphoretic.  Psychiatric: His behavior is normal.  Nursing note and vitals reviewed.   ED Course  Procedures (including critical care time) Labs Review Labs Reviewed  BASIC METABOLIC PANEL - Abnormal; Notable for the following:    Glucose, Bld 101 (*)    All other components within normal limits  CBC WITH DIFFERENTIAL/PLATELET - Abnormal; Notable for the following:    WBC 11.1 (*)    Platelets 144 (*)    Neutro Abs 9.1 (*)    All other components within normal limits  URINALYSIS, ROUTINE W REFLEX MICROSCOPIC (NOT AT [facility]) - Abnormal; Notable for the following:    APPearance CLOUDY (*)    Hgb urine dipstick TRACE (*)    All other components within normal limits  URINE  RAPID DRUG SCREEN, HOSP PERFORMED  ETHANOL  URINE MICROSCOPIC-ADD ON  CBG MONITORING, ED    Imaging Review No results found. I have personally reviewed and evaluated these images and lab results as part of my medical decision-making.   EKG Interpretation None       .LACERATION REPAIR Performed by: Arthor CaptainHarris, Shaquille Murdy Authorized by: Arthor CaptainHarris, Esmirna Ravan Consent: Verbal consent obtained. Risks and benefits: risks, benefits and alternatives were discussed Consent given by: patient Patient identity confirmed: provided demographic data Prepped and Draped in normal sterile fashion Wound explored  Laceration  Location: Right eyebrow  Laceration Length: 3 cm  No Foreign Bodies seen or palpated  Anesthesia: local infiltration  Local anesthetic: lidocaine 2 % with epinephrine  Anesthetic total: 3 ml  Irrigation method: syringe Amount of cleaning: standard  Skin closure: 5.0 Prolene   Number of sutures: 3   Technique: Simple interrupted   Patient tolerance: Patient tolerated the procedure well with no immediate complications.  LACERATION REPAIR Performed by: Arthor CaptainHarris, Edgar Reisz Authorized by: Arthor CaptainHarris, Kashif Pooler Consent: Verbal consent obtained. Risks and benefits: risks, benefits and alternatives were discussed Consent given by: patient Patient identity confirmed: provided demographic data Prepped and Draped in normal sterile fashion Wound explored  Laceration Location: Right lower eyelid region  Laceration Length: 1.5 cm  No Foreign Bodies seen or palpated  Anesthesia: local infiltration  Local anesthetic: lidocaine 2 % with epinephrine  Anesthetic total: 1 ml  Irrigation method: syringe Amount of cleaning: standard  Skin closure: 5. 0 Prolene   Number of sutures: 2   Technique: Simple interrupted   Patient tolerance: Patient tolerated the procedure well with no immediate complications.   MDM   Final diagnoses:  Seizure (HCC)  Laceration    Patient with tonic clonic seizure after several missed doses of his meds.  Patient lacerations repaired. Loading dose of 500 of lamictal given here.  Patient will pick up his meds in the morning. Tdap booster given.Pressure irrigation performed. Laceration occurred < 8 hours prior to repair which was well tolerated. Pt has no co morbidities to effect normal wound healing. Discussed suture home care w pt and answered questions. Pt to f-u for wound check and suture removal in 7 days. Pt is hemodynamically stable w no complaints prior to dc.   Home care instructions discussed  In simple terms and patient able to repeat  instructions using the teach back method. The patient appears reasonably screened and/or stabilized for discharge and I doubt any other medical condition or other [facility] requiring further screening, evaluation, or treatment in the ED at this time prior to discharge.     Arthor Captainbigail Josyah Achor, PA-C 05/13/15 1319  Raeford RazorStephen Kohut, MD 05/13/15 1430

## 2015-05-11 NOTE — ED Notes (Signed)
Father called requesting to speak to son.  He provided a local # of [phone].  Father is POA d/t pt is special needs.

## 2015-05-11 NOTE — ED Notes (Signed)
[facility] presents with a 24 yo male from home with a witnessed seizure and subsequent fall with facial laceration.  Pt had a witnessed seizure for approximately 3 minutes.  Pt has history of seizures.  Pt told [facility] he had run out of seizure medication (lamictal) for one day.  Hx of MR.  CBG 164.  Father Rosina Lowenstein([facility] Hughes) is POA.  [phone] [facility].

## 2015-05-12 NOTE — Discharge Instructions (Signed)
Please take your medication.  Seizure, Adult A seizure is abnormal electrical activity in the brain. Seizures usually last from 30 seconds to 2 minutes. There are various types of seizures. Before a seizure, you may have a warning sensation (aura) that a seizure is about to occur. An aura may include the following symptoms:   Fear or anxiety.  Nausea.  Feeling like the room is spinning (vertigo).  Vision changes, such as seeing flashing lights or spots. Common symptoms during a seizure include:  A change in attention or behavior (altered mental status).  Convulsions with rhythmic jerking movements.  Drooling.  Rapid eye movements.  Grunting.  Loss of bladder and bowel control.  Bitter taste in the mouth.  Tongue biting. After a seizure, you may feel confused and sleepy. You may also have an injury resulting from convulsions during the seizure. HOME CARE INSTRUCTIONS   If you are given medicines, take them exactly as prescribed by your health care provider.  Keep all follow-up appointments as directed by your health care provider.  Do not swim or drive or engage in risky activity during which a seizure could cause further injury to you or others until your health care provider says it is OK.  Get adequate rest.  Teach friends and family what to do if you have a seizure. They should:  Lay you on the ground to prevent a fall.  Put a cushion under your head.  Loosen any tight clothing around your neck.  Turn you on your side. If vomiting occurs, this helps keep your airway clear.  Stay with you until you recover.  Know whether or not you need emergency care. SEEK IMMEDIATE MEDICAL CARE IF:  The seizure lasts longer than 5 minutes.  The seizure is severe or you do not wake up immediately after the seizure.  You have an altered mental status after the seizure.  You are having more frequent or worsening seizures. Someone should drive you to the emergency  department or call local emergency services (911 in [place] MAKE SURE YOU:  Understand these instructions.  Will watch your condition.  Will get help right away if you are not doing well or get worse.   This information is not intended to replace advice given to you by your health care provider. Make sure you discuss any questions you have with your health care provider.   Document Released: 06/22/2000 Document Revised: 07/16/2014 Document Reviewed: 02/04/2013 Elsevier Interactive Patient Education 2016 [employer] Laceration Care, Adult A laceration is a cut that goes through all of the layers of the skin and into the tissue that is right under the skin. Some lacerations heal on their own. Others need to be closed with stitches (sutures), staples, skin adhesive strips, or skin glue. Proper laceration care minimizes the risk of infection and helps the laceration to heal better. HOW TO CARE FOR YOUR LACERATION If sutures or staples were used:  Keep the wound clean and dry.  If you were given a bandage (dressing), you should change it at least one time per day or as told by your health care provider. You should also change it if it becomes wet or dirty.  Keep the wound completely dry for the first 24 hours or as told by your health care provider. After that time, you may shower or bathe. However, make sure that the wound is not soaked in water until after the sutures or staples have been removed.  Clean the wound one time each  day or as told by your health care provider:  Wash the wound with soap and water.  Rinse the wound with water to remove all soap.  Pat the wound dry with a clean towel. Do not rub the wound.  After cleaning the wound, apply a thin layer of antibiotic ointmentas told by your health care provider. This will help to prevent infection and keep the dressing from sticking to the wound.  Have the sutures or staples removed as told by your health care provider. If  skin adhesive strips were used:  Keep the wound clean and dry.  If you were given a bandage (dressing), you should change it at least one time per day or as told by your health care provider. You should also change it if it becomes dirty or wet.  Do not get the skin adhesive strips wet. You may shower or bathe, but be careful to keep the wound dry.  If the wound gets wet, pat it dry with a clean towel. Do not rub the wound.  Skin adhesive strips fall off on their own. You may trim the strips as the wound heals. Do not remove skin adhesive strips that are still stuck to the wound. They will fall off in time. If skin glue was used:  Try to keep the wound dry, but you may briefly wet it in the shower or bath. Do not soak the wound in water, such as by swimming.  After you have showered or bathed, gently pat the wound dry with a clean towel. Do not rub the wound.  Do not do any activities that will make you sweat heavily until the skin glue has fallen off on its own.  Do not apply liquid, cream, or ointment medicine to the wound while the skin glue is in place. Using those may loosen the film before the wound has healed.  If you were given a bandage (dressing), you should change it at least one time per day or as told by your health care provider. You should also change it if it becomes dirty or wet.  If a dressing is placed over the wound, be careful not to apply tape directly over the skin glue. Doing that may cause the glue to be pulled off before the wound has healed.  Do not pick at the glue. The skin glue usually remains in place for 5-10 days, then it falls off of the skin. General Instructions  Take over-the-counter and prescription medicines only as told by your health care provider.  If you were prescribed an antibiotic medicine or ointment, take or apply it as told by your doctor. Do not stop using it even if your condition improves.  To help prevent scarring, make sure to  cover your wound with sunscreen whenever you are outside after stitches are removed, after adhesive strips are removed, or when glue remains in place and the wound is healed. Make sure to wear a sunscreen of at least 30 SPF.  Do not scratch or pick at the wound.  Keep all follow-up visits as told by your health care provider. This is important.  Check your wound every day for signs of infection. Watch for:  Redness, swelling, or pain.  Fluid, blood, or pus.  Raise (elevate) the injured area above the level of your heart while you are sitting or lying down, if possible. SEEK MEDICAL CARE IF:  You received a tetanus shot and you have swelling, severe pain, redness, or bleeding at  the injection site.  You have a fever.  A wound that was closed breaks open.  You notice a bad smell coming from your wound or your dressing.  You notice something coming out of the wound, such as wood or glass.  Your pain is not controlled with medicine.  You have increased redness, swelling, or pain at the site of your wound.  You have fluid, blood, or pus coming from your wound.  You notice a change in the color of your skin near your wound.  You need to change the dressing frequently due to fluid, blood, or pus draining from the wound.  You develop a new rash.  You develop numbness around the wound. SEEK IMMEDIATE MEDICAL CARE IF:  You develop severe swelling around the wound.  Your pain suddenly increases and is severe.  You develop painful lumps near the wound or on skin that is anywhere on your body.  You have a red streak going away from your wound.  The wound is on your hand or foot and you cannot properly move a finger or toe.  The wound is on your hand or foot and you notice that your fingers or toes look pale or bluish.   This information is not intended to replace advice given to you by your health care provider. Make sure you discuss any questions you have with your health care  provider.   Document Released: 06/25/2005 Document Revised: 11/09/2014 Document Reviewed: 06/21/2014 Elsevier Interactive Patient Education [employer]

## 2015-05-21 ENCOUNTER — Encounter (HOSPITAL_COMMUNITY): Payer: Self-pay | Admitting: Emergency Medicine

## 2015-05-21 ENCOUNTER — Emergency Department (HOSPITAL_COMMUNITY)
Admission: EM | Admit: 2015-05-21 | Discharge: 2015-05-21 | Disposition: A | Discharge status: Home / Self Care | Payer: Medicaid Other | Attending: Emergency Medicine | Admitting: Emergency Medicine

## 2015-05-21 DIAGNOSIS — F840 Autistic disorder: Secondary | ICD-10-CM | POA: Insufficient documentation

## 2015-05-21 DIAGNOSIS — G40909 Epilepsy, unspecified, not intractable, without status epilepticus: Secondary | ICD-10-CM | POA: Diagnosis not present

## 2015-05-21 DIAGNOSIS — R569 Unspecified convulsions: Secondary | ICD-10-CM | POA: Diagnosis present

## 2015-05-21 DIAGNOSIS — F1210 Cannabis abuse, uncomplicated: Secondary | ICD-10-CM | POA: Insufficient documentation

## 2015-05-21 DIAGNOSIS — Z79899 Other long term (current) drug therapy: Secondary | ICD-10-CM | POA: Insufficient documentation

## 2015-05-21 DIAGNOSIS — Z720 Tobacco use: Secondary | ICD-10-CM | POA: Diagnosis not present

## 2015-05-21 DIAGNOSIS — F1410 Cocaine abuse, uncomplicated: Secondary | ICD-10-CM | POA: Insufficient documentation

## 2015-05-21 LAB — ETHANOL

## 2015-05-21 LAB — CBC
HCT: 43.2 % (ref 39.0–52.0)
HEMOGLOBIN: 14.9 g/dL (ref 13.0–17.0)
MCH: 29.6 pg (ref 26.0–34.0)
MCHC: 34.5 g/dL (ref 30.0–36.0)
MCV: 85.9 fL (ref 78.0–100.0)
PLATELETS: 183 10*3/uL (ref 150–400)
RBC: 5.03 MIL/uL (ref 4.22–5.81)
RDW: 12.6 % (ref 11.5–15.5)
WBC: 7.1 10*3/uL (ref 4.0–10.5)

## 2015-05-21 LAB — RAPID URINE DRUG SCREEN, HOSP PERFORMED
Amphetamines: NOT DETECTED
BENZODIAZEPINES: NOT DETECTED
Barbiturates: NOT DETECTED
Cocaine: POSITIVE — AB
Opiates: NOT DETECTED
TETRAHYDROCANNABINOL: POSITIVE — AB

## 2015-05-21 LAB — COMPREHENSIVE METABOLIC PANEL
ALK PHOS: 116 U/L (ref 38–126)
ALT: 36 U/L (ref 17–63)
AST: 37 U/L (ref 15–41)
Albumin: 4.8 g/dL (ref 3.5–5.0)
Anion gap: 14 (ref 5–15)
BUN: 7 mg/dL (ref 6–20)
CALCIUM: 9.2 mg/dL (ref 8.9–10.3)
CHLORIDE: 102 mmol/L (ref 101–111)
CO2: 22 mmol/L (ref 22–32)
Creatinine, Ser: 1.18 mg/dL (ref 0.61–1.24)
GFR calc Af Amer: >60 mL/min (ref >60)
GFR calc non Af Amer: >60 mL/min (ref >60)
GLUCOSE: 79 mg/dL (ref 65–99)
Potassium: 3.8 mmol/L (ref 3.5–5.1)
SODIUM: 138 mmol/L (ref 135–145)
Total Bilirubin: 0.9 mg/dL (ref 0.3–1.2)
Total Protein: 7.2 g/dL (ref 6.5–8.1)

## 2015-05-21 NOTE — Discharge Instructions (Signed)
Your seizure may have been induced by other substances you ingested last night.  Do not drink alcohol or recreational drugs.  Continue your lamictal.  Return with recurrent seizure.  Follow up with your neurologist.  Seizure, Adult A seizure is abnormal electrical activity in the brain. Seizures usually last from 30 seconds to 2 minutes. There are various types of seizures. Before a seizure, you may have a warning sensation (aura) that a seizure is about to occur. An aura may include the following symptoms:   Fear or anxiety.  Nausea.  Feeling like the room is spinning (vertigo).  Vision changes, such as seeing flashing lights or spots. Common symptoms during a seizure include:  A change in attention or behavior (altered mental status).  Convulsions with rhythmic jerking movements.  Drooling.  Rapid eye movements.  Grunting.  Loss of bladder and bowel control.  Bitter taste in the mouth.  Tongue biting. After a seizure, you may feel confused and sleepy. You may also have an injury resulting from convulsions during the seizure. HOME CARE INSTRUCTIONS   If you are given medicines, take them exactly as prescribed by your health care provider.  Keep all follow-up appointments as directed by your health care provider.  Do not swim or drive or engage in risky activity during which a seizure could cause further injury to you or others until your health care provider says it is OK.  Get adequate rest.  Teach friends and family what to do if you have a seizure. They should:  Lay you on the ground to prevent a fall.  Put a cushion under your head.  Loosen any tight clothing around your neck.  Turn you on your side. If vomiting occurs, this helps keep your airway clear.  Stay with you until you recover.  Know whether or not you need emergency care. SEEK IMMEDIATE MEDICAL CARE IF:  The seizure lasts longer than 5 minutes.  The seizure is severe or you do not wake up  immediately after the seizure.  You have an altered mental status after the seizure.  You are having more frequent or worsening seizures. Someone should drive you to the emergency department or call local emergency services (911 in [place] MAKE SURE YOU:  Understand these instructions.  Will watch your condition.  Will get help right away if you are not doing well or get worse.   This information is not intended to replace advice given to you by your health care provider. Make sure you discuss any questions you have with your health care provider.   Document Released: 06/22/2000 Document Revised: 07/16/2014 Document Reviewed: 02/04/2013 Elsevier Interactive Patient Education [employer]

## 2015-05-21 NOTE — ED Notes (Signed)
Pt sts that he drank "a few beers last night". Pt has sutures over R eye from a previous injury. Pt is A&O and denies pain

## 2015-05-21 NOTE — ED Provider Notes (Signed)
CSN: [id number]     Arrival date & time 05/21/15  0846 History   First MD Initiated Contact with Patient 05/21/15 [phone]     Chief Complaint  Patient presents with  . Seizures     (Consider location/radiation/quality/duration/timing/severity/associated sxs/prior Treatment) HPI Comments: 24 y.o. Male with history of epilepsy, autism presents for seizure.  The patient was reportedly in a car with his friends when the seizure occurred.  He had reportedly been out all night partying.  No reported trauma and he denies any pain.  Patient reports that he is taking his Lamictal and does not need a  Refill.  Patient is requesting discharge as he says he feels fine - does not remember the name of his neurologist.    Past Medical History  Diagnosis Date  . Autism   . Seizure (HCC)   . Other specified pervasive developmental disorders, current or active state   . Borderline intellectual disability    Past Surgical History  Procedure Laterality Date  . Circumcision  1993   Family History  Problem Relation Age of Onset  . Other Mother     Mother was a slow learner was in special education until 6 th grade.  . Alcohol abuse Father     atient's father had a history of alcohol and drug abuse and behavior problems.   . Drug abuse Father    Social History  Substance Use Topics  . Smoking status: Current Some Day Smoker -- 0.50 packs/day  . Smokeless tobacco: Never Used  . Alcohol Use: 0.0 oz/week    0 Standard drinks or equivalent per week     Comment: A few drinks a week    Review of Systems  Constitutional: Negative for fever, chills, appetite change and fatigue.  HENT: Negative for congestion, postnasal drip, rhinorrhea and sinus pressure.   Eyes: Negative for photophobia, pain and visual disturbance.  Respiratory: Negative for chest tightness and shortness of breath.   Cardiovascular: Negative for chest pain and palpitations.  Gastrointestinal: Negative for nausea, vomiting, abdominal  pain and diarrhea.  Genitourinary: Negative for dysuria, urgency, frequency and flank pain.  Musculoskeletal: Negative for myalgias and back pain.  Neurological: Positive for seizures. Negative for dizziness, syncope, weakness and headaches.      Allergies  Review of patient's allergies indicates no known allergies.  Home Medications   Prior to Admission medications   Medication Sig Start Date End Date Taking? Authorizing Provider  lamoTRIgine (LAMICTAL) 100 MG tablet Take 3 1/2 tablets twice daily approximately 12 hours apart 02/09/15  Yes Deetta PerlaWilliam H Hickling, MD   BP 109/60 mmHg  Pulse 72  Temp(Src) 98.5 F (36.9 C) (Oral)  Resp 18  SpO2 98% Physical Exam  Constitutional: He is oriented to person, place, and time. He appears well-developed and well-nourished. No distress.  HENT:  Head: Normocephalic and atraumatic.  Right Ear: External ear normal.  Left Ear: External ear normal.  Mouth/Throat: Oropharynx is clear and moist. No oropharyngeal exudate.  Eyes: EOM are normal. Pupils are equal, round, and reactive to light.  Neck: Normal range of motion. Neck supple.  Cardiovascular: Normal rate, regular rhythm, normal heart sounds and intact distal pulses.   No murmur heard. Pulmonary/Chest: Effort normal. No respiratory distress. He has no wheezes. He has no rales.  Abdominal: Soft. He exhibits no distension. There is no tenderness.  Musculoskeletal: He exhibits no edema.  Neurological: He is alert and oriented to person, place, and time. He has normal strength. No cranial  nerve deficit or sensory deficit. Gait normal.  Skin: Skin is warm and dry. No rash noted. He is not diaphoretic.  Vitals reviewed.   ED Course  Procedures (including critical care time) Labs Review Labs Reviewed  URINE RAPID DRUG SCREEN, HOSP PERFORMED - Abnormal; Notable for the following:    Cocaine POSITIVE (*)    Tetrahydrocannabinol POSITIVE (*)    All other components within normal limits  CBC   COMPREHENSIVE METABOLIC PANEL  ETHANOL  LAMOTRIGINE LEVEL    Imaging Review No results found. I have personally reviewed and evaluated these images and lab results as part of my medical decision-making.   EKG Interpretation   Date/Time:  Saturday May 21 2015 09:10:39 EST Ventricular Rate:  86 PR Interval:  147 QRS Duration: 93 QT Interval:  379 QTC Calculation: 453 R Axis:   81 Text Interpretation:  Sinus rhythm ST elev, probable normal early repol  pattern Baseline wander in lead(s) V2 No previous ECGs available Confirmed  by Carrolyn Hilmes ([id number]) on 05/21/2015 9:28:45 AM      MDM  Patient seen and evaluated in stable condition.  Patient well appearing.  Neurologic examination normal.  EKg unremarkable.  Labs normal other than urine drug screen positive for cocaine and tetrahydrocannabinol.  Patient noted to have seizures induced by lack of sleep in the past and has not slept as he was out all night.  Seizure likely multifactorial from drug use and lack of sleep.  Patient discharged home in stable condition with strict return precautions and instruction to continue his Lamictal and to follow up with his neurologist. Final diagnoses:  Recurrent seizures (HCC)    1. Seizure, breakthrough/recurrent    Leta Baptist, MD 05/21/15 1106

## 2015-05-21 NOTE — ED Notes (Signed)
Pt from a car vis EMS after a seizure. Per EMS, pt was riding in a vehicle with a friend when he had a witnessed seizure. Pt sts that he had been "drinking all night ". Pt did not hit head or have incontinence. Pt has hx of seizures and sts that the has been taking his meds. Pt is A&O and in NAD. Upon arrival, pt ambulated to BR and provided a urine sample.

## 2015-05-21 NOTE — ED Notes (Signed)
Bed: [id number] Expected date: 05/21/15 Expected time: 8:41 AM Means of arrival: Ambulance Comments: seizures

## 2015-05-24 LAB — LAMOTRIGINE LEVEL: LAMOTRIGINE LVL: 8.4 ug/mL (ref 2.0–20.0)

## 2015-08-30 IMAGING — US US SOFT TISSUE HEAD/NECK
1 series · 14 of 18 positions shown · non-contrast
Comparison: None

CLINICAL DATA: Cervical lymphadenopathy.  History autism

EXAM:
ULTRASOUND OF HEAD/NECK SOFT TISSUES
TECHNIQUE: Ultrasound examination of the head and neck soft tissues was
performed in the area of clinical concern.

[Series 1: us soft tissue head/neck · 0.08mm/px · 14 of 18 slices shown]
[im 1/18]
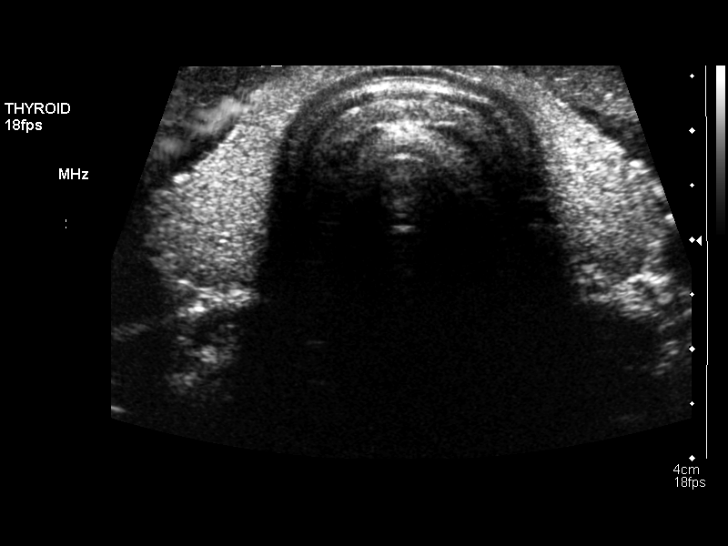
[im 2/18]
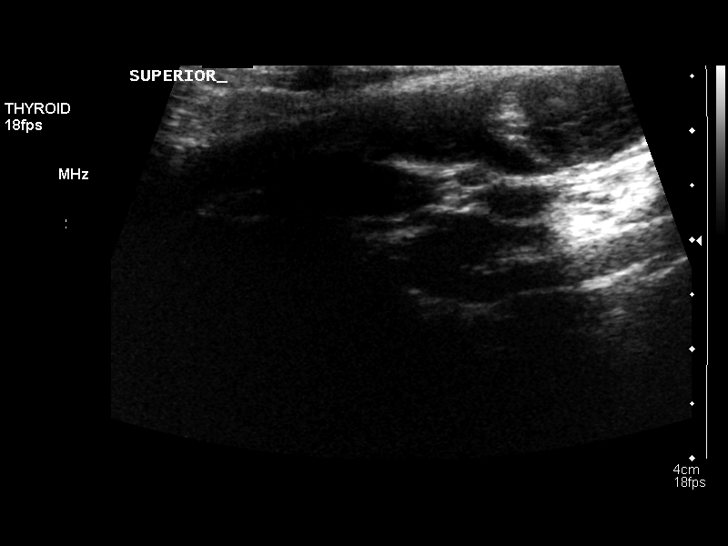
[im 4/18]
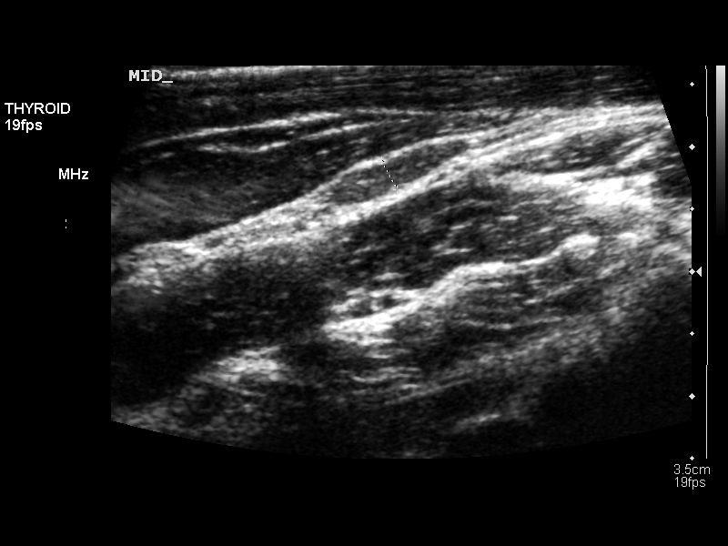
[im 5/18]
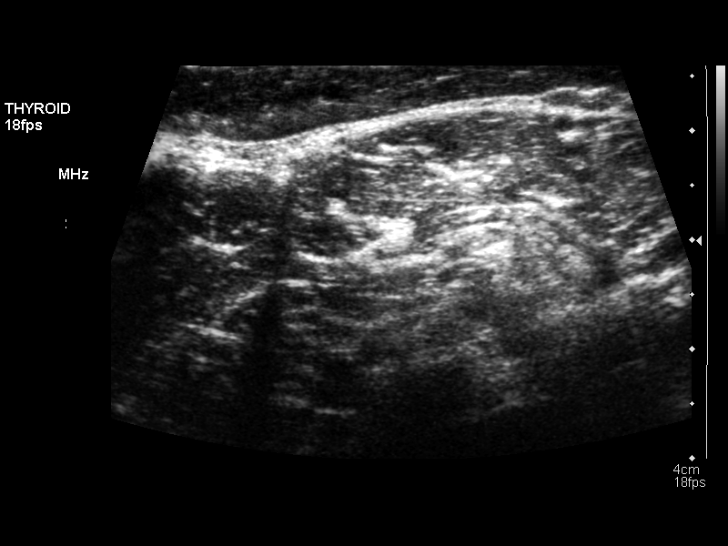
[im 6/18]
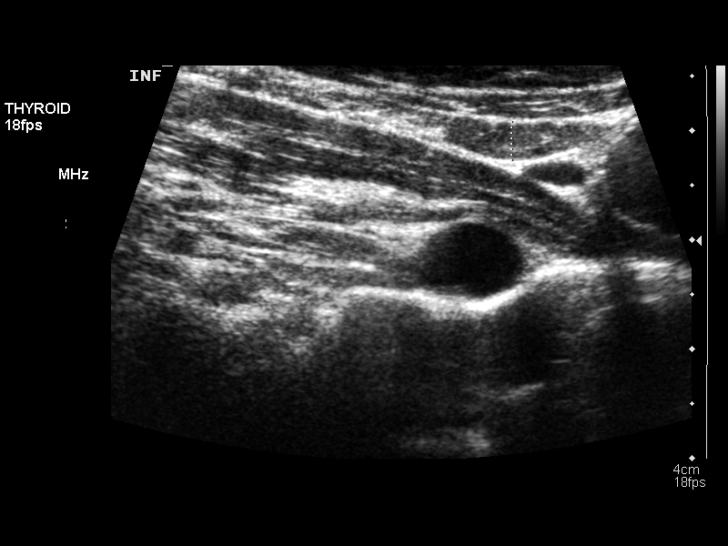
[im 8/18]
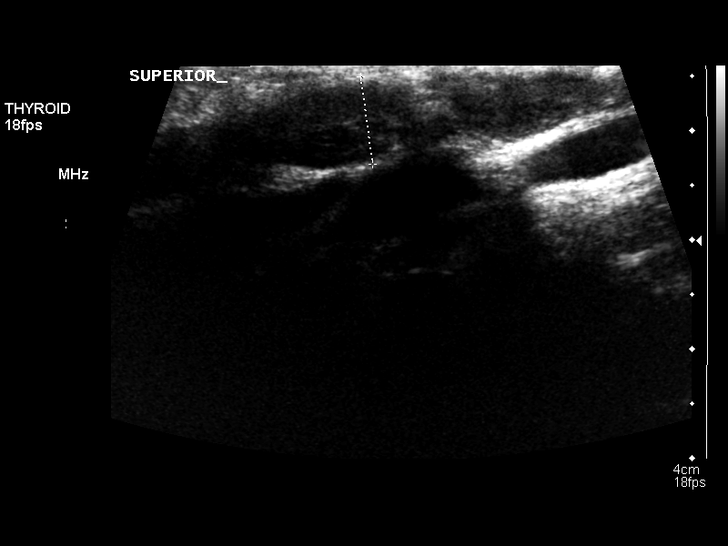
[im 9/18]
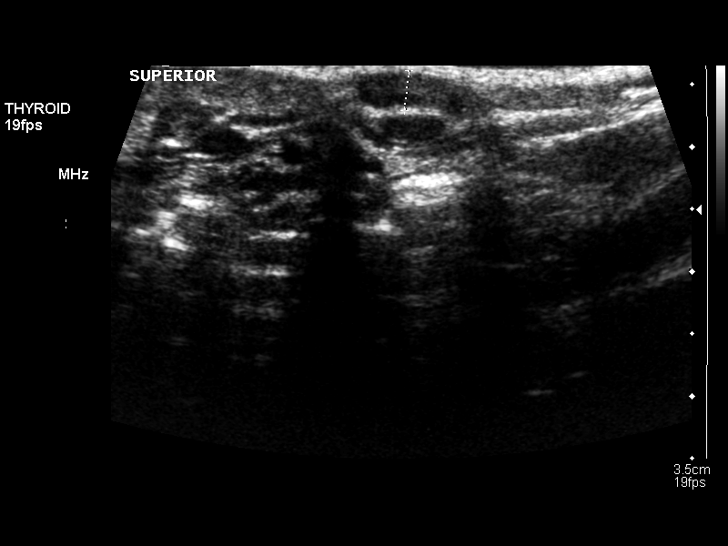
[im 10/18]
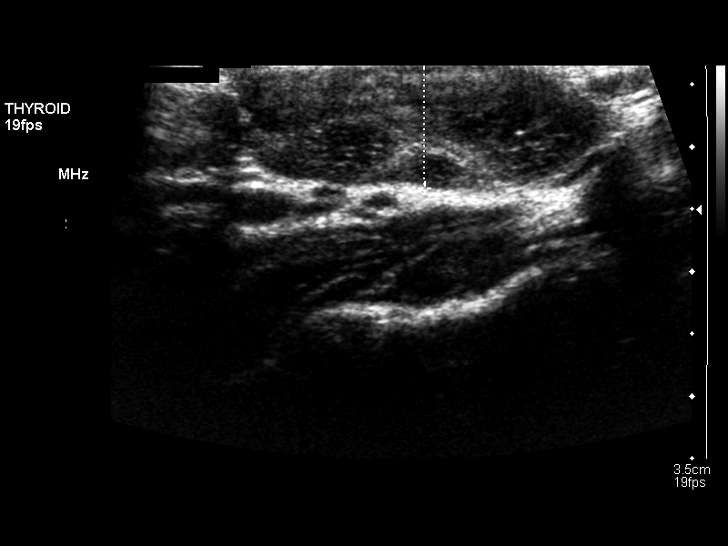
[im 11/18]
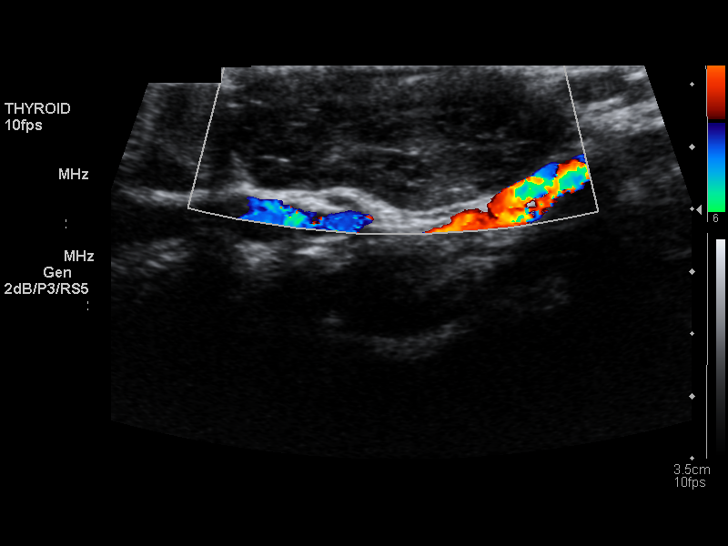
[im 13/18]
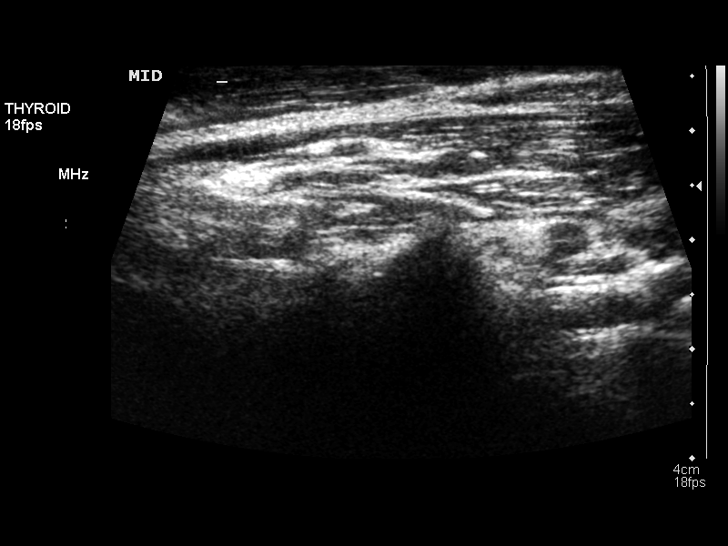
[im 14/18]
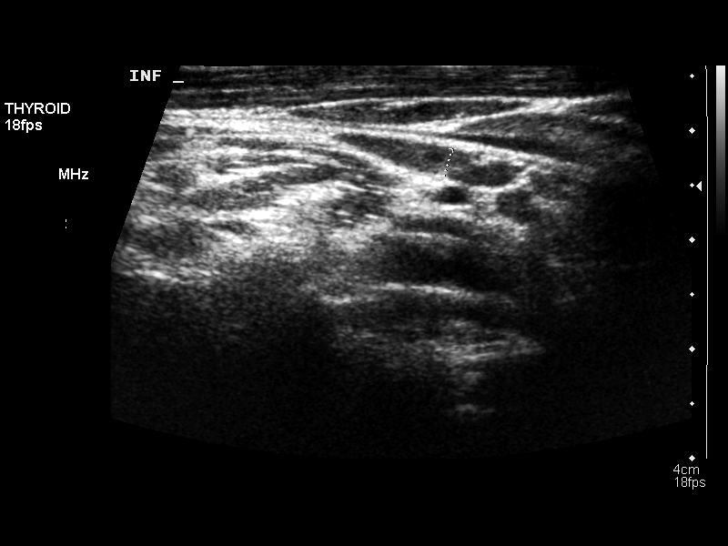
[im 15/18]
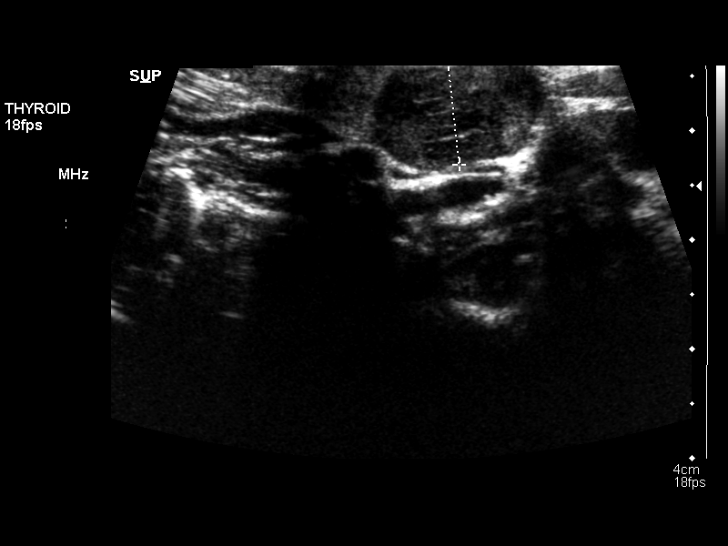
[im 17/18]
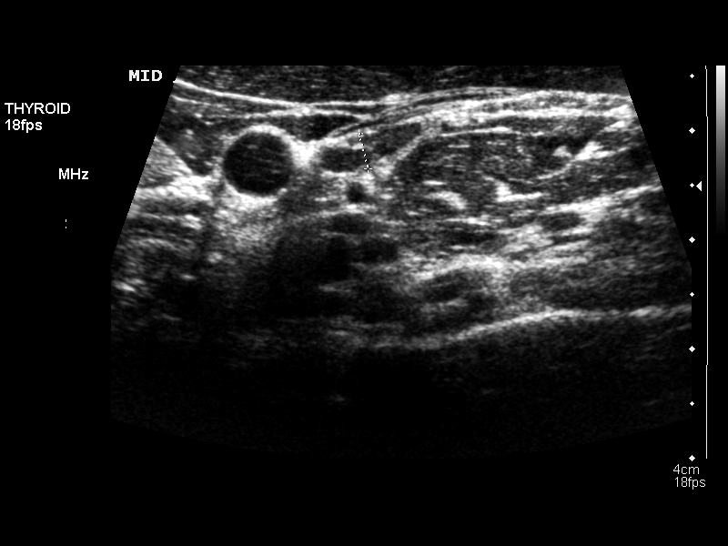
[im 18/18]
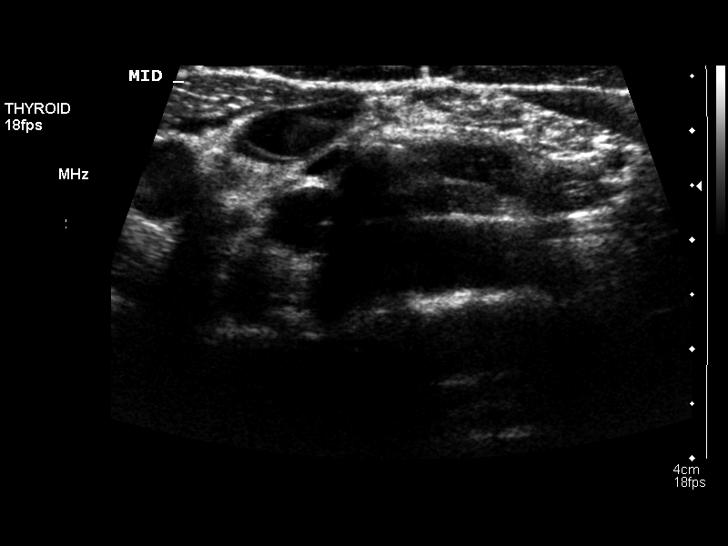

[14 of 18 positions shown; findings below may reference images not displayed]

FINDINGS: In the left neck, a single enlarged lymph node is identified
measuring 10 mm in short axis.

In the right neck, a single enlarged lymph node is identified
measuring 9 mm short axis.

Additional scattered normal size cervical lymph nodes are seen
bilaterally.

No abnormal fluid collection or additional mass.
IMPRESSION: Mildly enlarged and nonspecific bilateral cervical lymph nodes.

## 2015-09-24 ENCOUNTER — Emergency Department (HOSPITAL_COMMUNITY): Payer: Medicaid Other

## 2015-09-24 ENCOUNTER — Encounter (HOSPITAL_COMMUNITY): Payer: Self-pay | Admitting: Emergency Medicine

## 2015-09-24 ENCOUNTER — Emergency Department (HOSPITAL_COMMUNITY)
Admission: EM | Admit: 2015-09-24 | Discharge: 2015-09-24 | Disposition: A | Discharge status: Home / Self Care | Payer: Medicaid Other | Attending: Emergency Medicine | Admitting: Emergency Medicine

## 2015-09-24 DIAGNOSIS — S90416A Abrasion, unspecified lesser toe(s), initial encounter: Secondary | ICD-10-CM | POA: Insufficient documentation

## 2015-09-24 DIAGNOSIS — R569 Unspecified convulsions: Secondary | ICD-10-CM

## 2015-09-24 DIAGNOSIS — W1839XA Other fall on same level, initial encounter: Secondary | ICD-10-CM | POA: Diagnosis not present

## 2015-09-24 DIAGNOSIS — Y9289 Other specified places as the place of occurrence of the external cause: Secondary | ICD-10-CM | POA: Diagnosis not present

## 2015-09-24 DIAGNOSIS — G40909 Epilepsy, unspecified, not intractable, without status epilepticus: Secondary | ICD-10-CM | POA: Diagnosis not present

## 2015-09-24 DIAGNOSIS — L409 Psoriasis, unspecified: Secondary | ICD-10-CM | POA: Insufficient documentation

## 2015-09-24 DIAGNOSIS — Z23 Encounter for immunization: Secondary | ICD-10-CM | POA: Insufficient documentation

## 2015-09-24 DIAGNOSIS — Y9389 Activity, other specified: Secondary | ICD-10-CM | POA: Diagnosis not present

## 2015-09-24 DIAGNOSIS — S91119A Laceration without foreign body of unspecified toe without damage to nail, initial encounter: Secondary | ICD-10-CM | POA: Diagnosis not present

## 2015-09-24 DIAGNOSIS — S0101XA Laceration without foreign body of scalp, initial encounter: Secondary | ICD-10-CM | POA: Insufficient documentation

## 2015-09-24 DIAGNOSIS — F840 Autistic disorder: Secondary | ICD-10-CM | POA: Diagnosis not present

## 2015-09-24 DIAGNOSIS — Y998 Other external cause status: Secondary | ICD-10-CM | POA: Insufficient documentation

## 2015-09-24 DIAGNOSIS — F17200 Nicotine dependence, unspecified, uncomplicated: Secondary | ICD-10-CM | POA: Insufficient documentation

## 2015-09-24 LAB — I-STAT CHEM 8, ED
BUN: 17 mg/dL (ref 6–20)
CREATININE: 1 mg/dL (ref 0.61–1.24)
Calcium, Ion: 1.11 mmol/L — ABNORMAL LOW (ref 1.12–1.23)
Chloride: 100 mmol/L — ABNORMAL LOW (ref 101–111)
GLUCOSE: 80 mg/dL (ref 65–99)
HEMATOCRIT: 48 % (ref 39.0–52.0)
HEMOGLOBIN: 16.3 g/dL (ref 13.0–17.0)
Potassium: 4.1 mmol/L (ref 3.5–5.1)
Sodium: 138 mmol/L (ref 135–145)
TCO2: 24 mmol/L (ref 0–100)

## 2015-09-24 MED ORDER — LAMOTRIGINE 150 MG PO TABS: 350.0000 mg | ORAL_TABLET | Freq: Two times a day (BID) | ORAL | Status: DC

## 2015-09-24 MED ORDER — BACITRACIN ZINC 500 UNIT/GM EX OINT
1.0000 "application " | TOPICAL_OINTMENT | Freq: Once | CUTANEOUS | Status: AC
  Administered 2015-09-24: 1 via TOPICAL
  Filled 2015-09-24: qty 0.9

## 2015-09-24 MED ORDER — LAMOTRIGINE 200 MG PO TABS
350.0000 mg | ORAL_TABLET | Freq: Two times a day (BID) | ORAL | Status: DC
  Administered 2015-09-24: 350 mg via ORAL
  Filled 2015-09-24: qty 1

## 2015-09-24 MED ORDER — LIDOCAINE-EPINEPHRINE (PF) 2 %-1:200000 IJ SOLN
10.0000 mL | Freq: Once | INTRAMUSCULAR | Status: AC
  Administered 2015-09-24: 10 mL

## 2015-09-24 MED ORDER — TETANUS-DIPHTH-ACELL PERTUSSIS 5-2.5-18.5 LF-MCG/0.5 IM SUSP
0.5000 mL | Freq: Once | INTRAMUSCULAR | Status: AC
  Administered 2015-09-24: 0.5 mL via INTRAMUSCULAR
  Filled 2015-09-24: qty 0.5

## 2015-09-24 MED ORDER — LIDOCAINE-EPINEPHRINE (PF) 2 %-1:200000 IJ SOLN
INTRAMUSCULAR | Status: AC
  Filled 2015-09-24: qty 20

## 2015-09-24 NOTE — ED Notes (Signed)
Pt returned from CT via stretcher, PA at bedside for lac repair, wounds to feet, cleaned and dressed with Bacitracin and bandage.

## 2015-09-24 NOTE — ED Provider Notes (Signed)
Asked by Dr. Lynelle DoctorKnapp, to repair head laceration and wound care for foot.  Wound care also performed on foot.  No laceration repair indicated.  LACERATION REPAIR Performed by: Roxy HorsemanBROWNING, Merced Brougham Authorized by: Roxy HorsemanBROWNING, Kela Baccari Consent: Verbal consent obtained. Risks and benefits: risks, benefits and alternatives were discussed Consent given by: patient Patient identity confirmed: provided demographic data Prepped and Draped in normal sterile fashion Wound explored  Laceration Location: posterior scalp  Laceration Length: 5 cm  No Foreign Bodies seen or palpated  Anesthesia: local infiltration  Local anesthetic: lidocaine 2% with epinephrine  Anesthetic total: 5 ml  Irrigation method: syringe Amount of cleaning: standard  Skin closure: staples  Number of sutures: 5  Technique: staples  Patient tolerance: Patient tolerated the procedure well with no immediate complications.   Roxy HorsemanRobert Hailei Besser, PA-C 09/24/15 2039  Linwood DibblesJon Knapp, MD 09/24/15 [phone]

## 2015-09-24 NOTE — ED Notes (Signed)
Pt from home via EMS-Per EMS, pt father reports pt got out of shower and had seizure, falling to the floor. Pt has lac to back of head and L second toe and R great toe. Pt is non-compliant with meds and was "drinking heavily yesterday." Pt father described a grand mal seizure that lasted approx 3 minutes. Pt is A&O and in NAD

## 2015-09-24 NOTE — ED Notes (Signed)
Pt educated on dangers of drinking etoh and not taking medication for seizures. Father at bedside for d/c instructions

## 2015-09-24 NOTE — Discharge Instructions (Signed)
Epilepsy °Epilepsy is a disorder in which a person has repeated seizures over time. A seizure is a release of abnormal electrical activity in the brain. Seizures can cause a change in attention, behavior, or the ability to remain awake and alert (altered mental status). Seizures often involve uncontrollable shaking (convulsions).  °Most people with epilepsy lead normal lives. However, people with epilepsy are at an increased risk of falls, accidents, and injuries. Therefore, it is important to begin treatment right away. °CAUSES  °Epilepsy has many possible causes. Anything that disturbs the normal pattern of brain cell activity can lead to seizures. This may include:  °· Head injury. °· Birth trauma. °· High fever as a child. °· Stroke. °· Bleeding into or around the brain. °· Certain drugs. °· Prolonged low oxygen, such as what occurs after CPR efforts. °· Abnormal brain development. °· Certain illnesses, such as meningitis, encephalitis (brain infection), malaria, and other infections. °· An imbalance of nerve signaling chemicals (neurotransmitters).   °SIGNS AND SYMPTOMS  °The symptoms of a seizure can vary greatly from one person to another. Right before a seizure, you may have a warning (aura) that a seizure is about to occur. An aura may include the following symptoms: °· Fear or anxiety. °· Nausea. °· Feeling like the room is spinning (vertigo). °· Vision changes, such as seeing flashing lights or spots. °Common symptoms during a seizure include: °· Abnormal sensations, such as an abnormal smell or a bitter taste in the mouth.   °· Sudden, general body stiffness.   °· Convulsions that involve rhythmic jerking of the face, arm, or leg on one or both sides.   °· Sudden change in consciousness.   °· Appearing to be awake but not responding.   °· Appearing to be asleep but cannot be awakened.   °· Grimacing, chewing, lip smacking, drooling, tongue biting, or loss of bowel or bladder control. °After a seizure,  you may feel sleepy for a while.  °DIAGNOSIS  °Your health care provider will ask about your symptoms and take a medical history. Descriptions from any witnesses to your seizures will be very helpful in the diagnosis. A physical exam, including a detailed neurological exam, is necessary. Various tests may be done, such as:  °· An electroencephalogram (EEG). This is a painless test of your brain waves. In this test, a diagram is created of your brain waves. These diagrams can be interpreted by a specialist. °· An MRI of the brain.   °· A CT scan of the brain.   °· A spinal tap (lumbar puncture, LP). °· Blood tests to check for signs of infection or abnormal blood chemistry. °TREATMENT  °There is no cure for epilepsy, but it is generally treatable. Once epilepsy is diagnosed, it is important to begin treatment as soon as possible. For most people with epilepsy, seizures can be controlled with medicines. The following may also be used: °· A pacemaker for the brain (vagus nerve stimulator) can be used for people with seizures that are not well controlled by medicine. °· Surgery on the brain. °For some people, epilepsy eventually goes away. °HOME CARE INSTRUCTIONS  °· Follow your health care provider's recommendations on driving and safety in normal activities. °· Get enough rest. Lack of sleep can cause seizures. °· Only take over-the-counter or prescription medicines as directed by your health care provider. Take any prescribed medicine exactly as directed. °· Avoid any known triggers of your seizures. °· Keep a seizure diary. Record what you recall about any seizure, especially any possible trigger.   °· Make   sure the people you live and work with know that you are prone to seizures. They should receive instructions on how to help you. In general, a witness to a seizure should:   Cushion your head and body.   Turn you on your side.   Avoid unnecessarily restraining you.   Not place anything inside your  mouth.   Call for emergency medical help if there is any question about what has occurred.   Follow up with your health care provider as directed. You may need regular blood tests to monitor the levels of your medicine.  SEEK MEDICAL CARE IF:   You develop signs of infection or other illness. This might increase the risk of a seizure.   You seem to be having more frequent seizures.   Your seizure pattern is changing.  SEEK IMMEDIATE MEDICAL CARE IF:   You have a seizure that does not stop after a few moments.   You have a seizure that causes any difficulty in breathing.   You have a seizure that results in a very severe headache.   You have a seizure that leaves you with the inability to speak or use a part of your body.    This information is not intended to replace advice given to you by your health care provider. Make sure you discuss any questions you have with your health care provider.   Document Released: 06/25/2005 Document Revised: 04/15/2013 Document Reviewed: 02/04/2013 Elsevier Interactive Patient Education 2016 [employer]  Laceration Care, Adult A laceration is a cut that goes through all layers of the skin. The cut also goes into the tissue that is right under the skin. Some cuts heal on their own. Others need to be closed with stitches (sutures), staples, skin adhesive strips, or wound glue. Taking care of your cut lowers your risk of infection and helps your cut to heal better. HOW TO TAKE CARE OF YOUR CUT For stitches or staples:  Keep the wound clean and dry.  If you were given a bandage (dressing), you should change it at least one time per day or as told by your doctor. You should also change it if it gets wet or dirty.  Keep the wound completely dry for the first 24 hours or as told by your doctor. After that time, you may take a shower or a bath. However, make sure that the wound is not soaked in water until after the stitches or staples have  been removed.  Clean the wound one time each day or as told by your doctor:  Wash the wound with soap and water.  Rinse the wound with water until all of the soap comes off.  Pat the wound dry with a clean towel. Do not rub the wound.  After you clean the wound, put a thin layer of antibiotic ointment on it as told by your doctor. This ointment:  Helps to prevent infection.  Keeps the bandage from sticking to the wound.  Have your stitches or staples removed as told by your doctor. If your doctor used skin adhesive strips:   Keep the wound clean and dry.  If you were given a bandage, you should change it at least one time per day or as told by your doctor. You should also change it if it gets dirty or wet.  Do not get the skin adhesive strips wet. You can take a shower or a bath, but be careful to keep the wound dry.  If  the wound gets wet, pat it dry with a clean towel. Do not rub the wound.  Skin adhesive strips fall off on their own. You can trim the strips as the wound heals. Do not remove any strips that are still stuck to the wound. They will fall off after a while. If your doctor used wound glue:  Try to keep your wound dry, but you may briefly wet it in the shower or bath. Do not soak the wound in water, such as by swimming.  After you take a shower or a bath, gently pat the wound dry with a clean towel. Do not rub the wound.  Do not do any activities that will make you really sweaty until the skin glue has fallen off on its own.  Do not apply liquid, cream, or ointment medicine to your wound while the skin glue is still on.  If you were given a bandage, you should change it at least one time per day or as told by your doctor. You should also change it if it gets dirty or wet.  If a bandage is placed over the wound, do not let the tape for the bandage touch the skin glue.  Do not pick at the glue. The skin glue usually stays on for 5-10 days. Then, it falls off of  the skin. General Instructions  To help prevent scarring, make sure to cover your wound with sunscreen whenever you are outside after stitches are removed, after adhesive strips are removed, or when wound glue stays in place and the wound is healed. Make sure to wear a sunscreen of at least 30 SPF.  Take over-the-counter and prescription medicines only as told by your doctor.  If you were given antibiotic medicine or ointment, take or apply it as told by your doctor. Do not stop using the antibiotic even if your wound is getting better.  Do not scratch or pick at the wound.  Keep all follow-up visits as told by your doctor. This is important.  Check your wound every day for signs of infection. Watch for:  Redness, swelling, or pain.  Fluid, blood, or pus.  Raise (elevate) the injured area above the level of your heart while you are sitting or lying down, if possible. GET HELP IF:  You got a tetanus shot and you have any of these problems at the injection site:  Swelling.  Very bad pain.  Redness.  Bleeding.  You have a fever.  A wound that was closed breaks open.  You notice a bad smell coming from your wound or your bandage.  You notice something coming out of the wound, such as wood or glass.  Medicine does not help your pain.  You have more redness, swelling, or pain at the site of your wound.  You have fluid, blood, or pus coming from your wound.  You notice a change in the color of your skin near your wound.  You need to change the bandage often because fluid, blood, or pus is coming from the wound.  You start to have a new rash.  You start to have numbness around the wound. GET HELP RIGHT AWAY IF:  You have very bad swelling around the wound.  Your pain suddenly gets worse and is very bad.  You notice painful lumps near the wound or on skin that is anywhere on your body.  You have a red streak going away from your wound.  The wound is on your hand  or foot and you cannot move a finger or toe like you usually can.  The wound is on your hand or foot and you notice that your fingers or toes look pale or bluish.   This information is not intended to replace advice given to you by your health care provider. Make sure you discuss any questions you have with your health care provider.   Document Released: 12/12/2007 Document Revised: 11/09/2014 Document Reviewed: 06/21/2014 Elsevier Interactive Patient Education [employer]

## 2015-09-24 NOTE — ED Provider Notes (Signed)
CSN: [id number]     Arrival date & time 09/24/15  1835 History   First MD Initiated Contact with Patient 09/24/15 1920     Chief Complaint  Patient presents with  . Seizures  . Head Laceration   HPI Pt has history of seizures.  He is on lamotrigine but did not take his medication today.  Pt was drinking alcohol last evening.  He was at home today.  He was in the bathroom and had a tonic clonic siezure witnessed but his father.  It lasted for 3 minutes.  Pt now feels thirsty, but feels nauseated.  He has a mild headache and lacerated his head.  He also lacerated his toes on the right foot.  Past Medical History  Diagnosis Date  . Autism   . Seizure (HCC)   . Other specified pervasive developmental disorders, current or active state   . Borderline intellectual disability    Past Surgical History  Procedure Laterality Date  . Circumcision  1993   Family History  Problem Relation Age of Onset  . Other Mother     Mother was a slow learner was in special education until 6 th grade.  . Alcohol abuse Father     atient's father had a history of alcohol and drug abuse and behavior problems.   . Drug abuse Father    Social History  Substance Use Topics  . Smoking status: Current Some Day Smoker -- 0.50 packs/day  . Smokeless tobacco: Never Used  . Alcohol Use: 0.0 oz/week    0 Standard drinks or equivalent per week     Comment: A few drinks a week    Review of Systems  All other systems reviewed and are negative.     Allergies  Review of patient's allergies indicates no known allergies.  Home Medications   Prior to Admission medications   Medication Sig Start Date End Date Taking? Authorizing Provider  lamoTRIgine (LAMICTAL) 100 MG tablet Take 3 1/2 tablets twice daily approximately 12 hours apart Patient taking differently: Take 350 mg by mouth every 12 (twelve) hours.  02/09/15  Yes Deetta PerlaWilliam H Hickling, MD  methotrexate (RHEUMATREX) 2.5 MG tablet Take 2.5-5 mg by mouth See  admin instructions. Take 5mg  on day 1 & 2.5 mg on day 2 08/30/15  Yes Historical Provider, MD   BP 127/67 mmHg  Pulse 75  Temp(Src) 97.2 F (36.2 C) (Oral)  Resp 18  SpO2 94% Physical Exam  Constitutional: He appears well-developed and well-nourished. No distress.  HENT:  Head: Normocephalic and atraumatic.  Right Ear: External ear normal.  Left Ear: External ear normal.  Linear laceration posterior right scalp, no active bleeding  Eyes: Conjunctivae are normal. Right eye exhibits no discharge. Left eye exhibits no discharge. No scleral icterus.  Neck: Neck supple. No tracheal deviation present.  Cardiovascular: Normal rate, regular rhythm and intact distal pulses.   Pulmonary/Chest: Effort normal and breath sounds normal. No stridor. No respiratory distress. He has no wheezes. He has no rales.  Abdominal: Soft. Bowel sounds are normal. He exhibits no distension. There is no tenderness. There is no rebound and no guarding.  Musculoskeletal: He exhibits no edema or tenderness.       Cervical back: Normal.       Thoracic back: Normal.       Lumbar back: Normal.  Superficial abrasions and lacerations of the toes on both feet, well approximated, no need for suture  Neurological: He is alert. He has normal strength.  No cranial nerve deficit (no facial droop, extraocular movements intact, no slurred speech) or sensory deficit. He exhibits normal muscle tone. He displays no seizure activity. Coordination normal.  Skin: Skin is warm and dry. Rash noted.  Psoriasis rash on extremities   Psychiatric: He has a normal mood and affect.  Nursing note and vitals reviewed.   ED Course  Procedures (including critical care time) Labs Review Labs Reviewed  I-STAT CHEM 8, ED - Abnormal; Notable for the following:    Chloride 100 (*)    Calcium, Ion 1.11 (*)    All other components within normal limits    Imaging Review Ct Head Wo Contrast  09/24/2015  CLINICAL DATA:  Status post seizure while  getting out of shower, with fall onto floor. Laceration at the back of the head. Initial encounter. EXAM: CT HEAD WITHOUT CONTRAST TECHNIQUE: Contiguous axial images were obtained from the base of the skull through the vertex without intravenous contrast. COMPARISON:  CT of the head performed 01/22/2015 FINDINGS: There is no evidence of acute infarction, mass lesion, or intra- or extra-axial hemorrhage on CT. The posterior fossa, including the cerebellum, brainstem and fourth ventricle, is within normal limits. The third and lateral ventricles, and basal ganglia are unremarkable in appearance. The cerebral hemispheres are symmetric in appearance, with normal gray-white differentiation. No mass effect or midline shift is seen. There is no evidence of fracture; visualized osseous structures are unremarkable in appearance. The visualized portions of the orbits are within normal limits. The paranasal sinuses and mastoid air cells are well-aerated. Mild soft tissue injury is noted at the right occiput. IMPRESSION: 1. No evidence of traumatic intracranial injury or fracture. 2. Mild soft tissue injury at the right occiput. Electronically Signed   By: Roanna Raider M.D.   On: 09/24/2015 20:34   I have personally reviewed and evaluated these images and lab results as part of my medical decision-making.  Medications  lamoTRIgine (LAMICTAL) tablet 350 mg (350 mg Oral Given 09/24/15 1951)  lidocaine-EPINEPHrine (XYLOCAINE W/EPI) 2 %-1:200000 (PF) injection (not administered)  lidocaine-EPINEPHrine (XYLOCAINE W/EPI) 2 %-1:200000 (PF) injection 10 mL (10 mLs Infiltration Given 09/24/15 1946)  Tdap (BOOSTRIX) injection 0.5 mL (0.5 mLs Intramuscular Given 09/24/15 1946)  bacitracin ointment 1 application (1 application Topical Given 09/24/15 1947)     MDM   Final diagnoses:  Seizure (HCC)  Scalp laceration, initial encounter    Patient had a seizure most likely related to medical noncompliance and possibly his  alcohol CONSUMPTION last evening. He has remained stable in the emergency room with no further seizure activity. Electrolyte panel is unremarkable. CT scan does not show any significant injury. Patient is stable for discharge  Linwood Dibbles, MD 09/24/15 2144

## 2015-09-24 NOTE — ED Notes (Signed)
Pt admits to missing his medication at times and drinking etoh. Trauma noted to R great toe, L2nd toe and lacerations to back of head. Bleeding controlled.

## 2015-09-25 ENCOUNTER — Telehealth: Payer: Self-pay | Admitting: Emergency Medicine

## 2015-10-19 ENCOUNTER — Telehealth: Payer: Self-pay

## 2015-10-19 NOTE — Telephone Encounter (Signed)
I left a message for her to call back. I'm unable to immediately answer call like this unless it is between 1:30 and 2 PM.

## 2015-10-19 NOTE — Telephone Encounter (Signed)
Sherril Croonamesha Goddard from [facility] [employer] [facility] called stating that she would like to ask a few questions with some thins and she needs our assistance. She states that the patient will be sitting there with her through the whole conversation. She is requesting a call  Back.  CB:[phone] ext: [phone]

## 2016-02-02 DIAGNOSIS — Z0289 Encounter for other administrative examinations: Secondary | ICD-10-CM

## 2016-08-31 ENCOUNTER — Encounter (HOSPITAL_COMMUNITY): Payer: Self-pay | Admitting: Emergency Medicine

## 2016-08-31 ENCOUNTER — Emergency Department (HOSPITAL_COMMUNITY): Payer: Medicare Other

## 2016-08-31 ENCOUNTER — Emergency Department (HOSPITAL_COMMUNITY)
Admission: EM | Admit: 2016-08-31 | Discharge: 2016-08-31 | Disposition: A | Discharge status: Home / Self Care | Payer: Medicare Other | Attending: Emergency Medicine | Admitting: Emergency Medicine

## 2016-08-31 DIAGNOSIS — R569 Unspecified convulsions: Secondary | ICD-10-CM | POA: Diagnosis present

## 2016-08-31 DIAGNOSIS — Z79899 Other long term (current) drug therapy: Secondary | ICD-10-CM | POA: Insufficient documentation

## 2016-08-31 DIAGNOSIS — F840 Autistic disorder: Secondary | ICD-10-CM | POA: Insufficient documentation

## 2016-08-31 DIAGNOSIS — G40909 Epilepsy, unspecified, not intractable, without status epilepticus: Secondary | ICD-10-CM | POA: Diagnosis not present

## 2016-08-31 DIAGNOSIS — F17200 Nicotine dependence, unspecified, uncomplicated: Secondary | ICD-10-CM | POA: Diagnosis not present

## 2016-08-31 DIAGNOSIS — E876 Hypokalemia: Secondary | ICD-10-CM | POA: Insufficient documentation

## 2016-08-31 LAB — I-STAT CHEM 8, ED
BUN: 7 mg/dL (ref 6–20)
Calcium, Ion: 0.8 mmol/L — CL (ref 1.15–1.40)
Chloride: 113 mmol/L — ABNORMAL HIGH (ref 101–111)
Creatinine, Ser: 0.7 mg/dL (ref 0.61–1.24)
Glucose, Bld: 73 mg/dL (ref 65–99)
HEMATOCRIT: 34 % — AB (ref 39.0–52.0)
HEMOGLOBIN: 11.6 g/dL — AB (ref 13.0–17.0)
Potassium: 3.1 mmol/L — ABNORMAL LOW (ref 3.5–5.1)
SODIUM: 143 mmol/L (ref 135–145)
TCO2: 19 mmol/L (ref 0–100)

## 2016-08-31 LAB — CALCIUM: CALCIUM: 8.5 mg/dL — AB (ref 8.9–10.3)

## 2016-08-31 LAB — MAGNESIUM: Magnesium: 2.4 mg/dL (ref 1.7–2.4)

## 2016-08-31 MED ORDER — SODIUM CHLORIDE 0.9 % IV BOLUS (SEPSIS)
1000.0000 mL | Freq: Once | INTRAVENOUS | Status: AC
  Administered 2016-08-31: 1000 mL via INTRAVENOUS

## 2016-08-31 MED ORDER — LAMOTRIGINE 25 MG PO TABS
350.0000 mg | ORAL_TABLET | Freq: Once | ORAL | Status: AC
  Administered 2016-08-31: 350 mg via ORAL
  Filled 2016-08-31: qty 2

## 2016-08-31 MED ORDER — POTASSIUM CHLORIDE CRYS ER 20 MEQ PO TBCR: 20.0000 meq | EXTENDED_RELEASE_TABLET | Freq: Two times a day (BID) | ORAL | 0 refills | Status: DC

## 2016-08-31 MED ORDER — CALCIUM GLUCONATE 10 % IV SOLN: 1.0000 g | Freq: Once | INTRAVENOUS | Status: DC

## 2016-08-31 MED ORDER — SODIUM CHLORIDE 0.9 % IV SOLN
1.0000 g | Freq: Once | INTRAVENOUS | Status: AC
  Administered 2016-08-31: 1 g via INTRAVENOUS
  Filled 2016-08-31: qty 10

## 2016-08-31 MED ORDER — POTASSIUM CHLORIDE CRYS ER 20 MEQ PO TBCR
40.0000 meq | EXTENDED_RELEASE_TABLET | Freq: Once | ORAL | Status: AC
  Administered 2016-08-31: 40 meq via ORAL
  Filled 2016-08-31: qty 2

## 2016-08-31 NOTE — Discharge Instructions (Signed)
Please follow-up with her primary care doctor next week to have your potassium and calcium rechecked. Please take her medicines as prescribed. I have given you and ambulatory referral to neurology. They will call with an appointment. Return to ED if you have any worsening symptoms.

## 2016-08-31 NOTE — ED Triage Notes (Signed)
Pt was found down seizing on the ground at an apartment complex in town.  Pt was walking home to his apartment and began seizing.  Pt states that his Hx of seizures is well controlled generally but today he states that his seizure today was brought on by a "first time use of crack cocaine".

## 2016-08-31 NOTE — ED Notes (Signed)
Seizure precautions initiated.

## 2016-08-31 NOTE — Progress Notes (Signed)
CSW met with pt after receiving call from pt's RN who stated pt required transportation.  CSW was initially told by pt he needed transportation.  Per notes, ptwas "special needs" and CSW was informed pt was autistic.  Per notes pt's father was P.O.A., but CSW could not find evidence of a legal guardian and pt stated he was his own legal guardian.  After some time pt called a friend named Marianna Fuss to pick him up and CSW was by the pt she would arrive within 20 minutes to the ED.  CSW notified front desk security that pt in room [address] 04 was expecting a ride.  CSW informed RN.   CSW signing off.     Alphonse Guild. Tulio Facundo, Latanya Presser, LCAS Clinical Social Worker Ph: [phone]

## 2016-08-31 NOTE — ED Provider Notes (Signed)
[facility]-EMERGENCY DEPT Provider Note   CSN: [id number] Arrival date & time: 08/31/16  1604     History   Chief Complaint Chief Complaint  Patient presents with  . Seizures    HPI Douglas Levine is a 26 y.o. male.  HPI 26 year old Caucasian male with a past medical history significant for epilepsy, autism presents to the ED today after sustaining a seizure prior to arrival. Patient states that he was walking home to his apartment and began seizing. The patient states that his seizure was witnessed by friends however when EMS arrived there is no around the patient. The patient states he does have a history of seizures and is generally well controlled with his Lamictal. States he takes it regularly twice a day. States he feels like his seizure is brought on by his "first time use of crack cocaine". States that he was doing some "nasty stuff" before his seizure today. Patient does have history of autism and lives independently with his roommate. He is not followed by a neurologist. Patient is unsure of how long she was down. States he did hit his head. Denies any headache or neck pain at this time. Denies any vision changes. Patient denies biting his tongue or incontinence. Has been ambulatory since the event. Denies any fever, chills, headache, vision changes, lightheadedness, dizziness, chest pain shortness of breath, abdominal pain, nausea, emesis, urinary symptoms, change in bowel habits. Patient states that his last seizure was approximately 4-5 months ago.  Past Medical History:  Diagnosis Date  . Autism   . Borderline intellectual disability   . Other specified pervasive developmental disorders, current or active state   . Seizure [facility])     Patient Active Problem List   Diagnosis Date Noted  . Autism spectrum disorder with accompanying intellectual impairment, requiring support (level 1) 06/21/2014  . Encounter for long-term (current) use of other medications 01/13/2013  . Generalized  nonconvulsive epilepsy (HCC) 01/13/2013  . Generalized convulsive epilepsy (HCC) 01/13/2013  . Other specified pervasive developmental disorders, current or active state 01/13/2013    Past Surgical History:  Procedure Laterality Date  . CIRCUMCISION  1993       Home Medications    Prior to Admission medications   Medication Sig Start Date End Date Taking? Authorizing Provider  lamoTRIgine (LAMICTAL) 100 MG tablet Take 3 1/2 tablets twice daily approximately 12 hours apart Patient taking differently: Take 350 mg by mouth every 12 (twelve) hours.  02/09/15  Yes Deetta Perla, MD    Family History Family History  Problem Relation Age of Onset  . Other Mother     Mother was a slow learner was in special education until 6 th grade.  . Alcohol abuse Father     atient's father had a history of alcohol and drug abuse and behavior problems.   . Drug abuse Father     Social History Social History  Substance Use Topics  . Smoking status: Current Some Day Smoker    Packs/day: 0.50  . Smokeless tobacco: Never Used  . Alcohol use 0.0 oz/week     Comment: A few drinks a week     Allergies   Patient has no known allergies.   Review of Systems Review of Systems  Constitutional: Negative for chills and fever.  HENT: Negative for congestion.   Eyes: Negative for visual disturbance.  Respiratory: Negative for cough and shortness of breath.   Cardiovascular: Negative for chest pain.  Gastrointestinal: Negative for abdominal pain, diarrhea, nausea  and vomiting.  Genitourinary: Negative for dysuria, frequency, hematuria and urgency.  Musculoskeletal: Negative for back pain, neck pain and neck stiffness.  Skin: Negative for wound.  Neurological: Negative for dizziness, syncope, weakness, light-headedness and headaches.  All other systems reviewed and are negative.    Physical Exam Updated Vital Signs BP 119/66   Pulse 72   Temp 98.5 F (36.9 C) (Oral)   Resp 16   Ht 6'  2" (1.88 m)   Wt 81.6 kg   SpO2 98%   BMI 23.11 kg/m   Physical Exam  Constitutional: He is oriented to person, place, and time. He appears well-developed and well-nourished. No distress.  Answers questions appropriately.  HENT:  Head: Normocephalic and atraumatic.  Right Ear: Tympanic membrane, external ear and ear canal normal. No hemotympanum.  Left Ear: Tympanic membrane, external ear and ear canal normal. No hemotympanum.  Nose: Nose normal. No nasal septal hematoma.  Mouth/Throat: Uvula is midline, oropharynx is clear and moist and mucous membranes are normal.  No signs of hematoma or abrasions to the scalp. No skull depression.  Eyes: Conjunctivae and EOM are normal. Pupils are equal, round, and reactive to light. Right eye exhibits no discharge. Left eye exhibits no discharge. No scleral icterus.  Neck: Normal range of motion. Neck supple. No thyromegaly present.  Full range of motion. No midline tenderness. No deformities or step-offs noted.  Cardiovascular: Normal rate, regular rhythm, normal heart sounds and intact distal pulses.   Pulmonary/Chest: Effort normal and breath sounds normal.  Abdominal: Soft. Bowel sounds are normal. He exhibits no distension. There is no tenderness. There is no rebound and no guarding.  Musculoskeletal: Normal range of motion. He exhibits no tenderness or deformity.  MAEx4. No midline T-spine or L-spine tenderness.  Step-offs noted. No paraspinal tenderness. Full range of motion.  Lymphadenopathy:    He has no cervical adenopathy.  Neurological: He is alert and oriented to person, place, and time.  The patient is alert, attentive, and oriented x 3. Speech is clear. Cranial nerve II-VII grossly intact. Negative pronator drift. Sensation intact. Strength 5/5 in all extremities. Reflexes 2+ and symmetric at biceps, triceps, knees, and ankles. Rapid alternating movement and fine finger movements intact. Did not assess Romberg or gait due to seizure  precautions.   Skin: Skin is warm and dry. Capillary refill takes less than 2 seconds.  Nursing note and vitals reviewed.    ED Treatments / Results  Labs (all labs ordered are listed, but only abnormal results are displayed) Labs Reviewed  CALCIUM - Abnormal; Notable for the following:       Result Value   Calcium 8.5 (*)    All other components within normal limits  I-STAT CHEM 8, ED - Abnormal; Notable for the following:    Potassium 3.1 (*)    Chloride 113 (*)    Calcium, Ion 0.80 (*)    Hemoglobin 11.6 (*)    HCT 34.0 (*)    All other components within normal limits  MAGNESIUM    EKG  EKG Interpretation  Date/Time:  Friday August 31 2016 16:34:26 EST Ventricular Rate:  66 PR Interval:    QRS Duration: 90 QT Interval:  400 QTC Calculation: 420 R Axis:   87 Text Interpretation:  Sinus rhythm ST elev, probable normal early repol pattern No significant change since last tracing Confirmed by FLOYD MD, DANIEL [id number]) on 08/31/2016 7:37:24 PM       Radiology Ct Head Wo Contrast  Result Date:  08/31/2016 CLINICAL DATA:  Seizure, fall, history of seizures EXAM: CT HEAD WITHOUT CONTRAST TECHNIQUE: Contiguous axial images were obtained from the base of the skull through the vertex without intravenous contrast. COMPARISON:  09/24/2015 FINDINGS: Brain: No intracranial hemorrhage, mass effect or midline shift. No acute cortical infarction. No mass lesion is noted on this unenhanced scan. The gray and white-matter differentiation is preserved. Vascular: No hyperdense vessel or unexpected calcification. Skull: Normal. Negative for fracture or focal lesion. Sinuses/Orbits: No paranasal sinuses air-fluid levels. The mastoid air cells are unremarkable. Other: None IMPRESSION: No acute intracranial abnormality.  No significant change. Electronically Signed   By: Natasha Mead M.D.   On: 08/31/2016 17:24    Procedures Procedures (including critical care time)  Medications Ordered in  ED Medications  sodium chloride 0.9 % bolus 1,000 mL (0 mLs Intravenous Stopped 08/31/16 1758)  potassium chloride SA (K-DUR,KLOR-CON) CR tablet 40 mEq (40 mEq Oral Given 08/31/16 2008)  lamoTRIgine (LAMICTAL) tablet 350 mg (350 mg Oral Given 08/31/16 2008)  calcium gluconate 1 g in sodium chloride 0.9 % 100 mL IVPB (0 g Intravenous Stopped 08/31/16 2145)     Initial Impression / Assessment and Plan / ED Course  I have reviewed the triage vital signs and the nursing notes.  Pertinent labs & imaging results that were available during my care of the patient were reviewed by me and considered in my medical decision making (see chart for details).     Patient with no evidence of focal neuro deficits on physical exam and is at mental baseline. Patient with history of seizure. States that he takes his medications regularly. Not followed by a neurologist.Seizure occurred after using crack cocaine this afternoon. Patient with mild hypocalcemia and hypokalemia. EKG with normal sinus rhythm and no change from prior tracings. Potassium was orally replaced. Patient given 1 g of calcium gluconate. Given a liter of fluid. Has been greater than 6 hours from seizure without any further seizure-like activity. Labs and imaging have been reviewed.Skin without any acute changes. Able to by mouth tolerate. Spoke with Dr. Roseanne Reno with neurology to discuss patient. Recommends current Lamictal regimen. Has no further recommendations will follow up in the outpatient setting. Patient has no complaints prior to discharge.  Patient is advised to followup with neurologist in regards to today's event.  Patient given ambulatory referral to neurology. Given potassium supplements. Encouraged to follow up with his PCP next week to have his calcium and potassium rechecked. Unclear etiology of hypocalcemia. Encouraged to follow up with PCP for further workup. Patient verbalizes understanding.  Answered all questions.  Patient is  hemodynamically stable, vs are normal, and in no acute distress prior to discharge.Friend at bedside when a sure patient also with PCP. Discussed patient with Dr. Adela Lank who agrees with the above plan.    Final Clinical Impressions(s) / ED Diagnoses   Final diagnoses:  Seizure (HCC)  Hypokalemia  Hypocalcemia    New Prescriptions Discharge Medication List as of 08/31/2016  9:40 PM    START taking these medications   Details  potassium chloride SA (K-DUR,KLOR-CON) 20 MEQ tablet Take 1 tablet (20 mEq total) by mouth 2 (two) times daily., Starting Fri 08/31/2016, Print         Rise Mu, PA-C 08/31/16 2353    Melene Plan, DO 09/01/16 1610

## 2016-08-31 NOTE — ED Notes (Signed)
Bed: WA04 Expected date:  Expected time:  Means of arrival: Ambulance Comments: EMS seizure

## 2016-08-31 NOTE — ED Triage Notes (Signed)
Pt lives independently with a roommate.

## 2016-08-31 NOTE — ED Notes (Signed)
Pt given coke, sandwich

## 2017-03-21 ENCOUNTER — Emergency Department (HOSPITAL_COMMUNITY)
Admission: EM | Admit: 2017-03-21 | Discharge: 2017-03-21 | Disposition: A | Discharge status: Home / Self Care | Payer: Medicare Other | Attending: Emergency Medicine | Admitting: Emergency Medicine

## 2017-03-21 ENCOUNTER — Encounter (HOSPITAL_COMMUNITY): Payer: Self-pay | Admitting: Emergency Medicine

## 2017-03-21 DIAGNOSIS — F1910 Other psychoactive substance abuse, uncomplicated: Secondary | ICD-10-CM | POA: Insufficient documentation

## 2017-03-21 DIAGNOSIS — F17200 Nicotine dependence, unspecified, uncomplicated: Secondary | ICD-10-CM | POA: Insufficient documentation

## 2017-03-21 DIAGNOSIS — F840 Autistic disorder: Secondary | ICD-10-CM | POA: Diagnosis not present

## 2017-03-21 DIAGNOSIS — R4183 Borderline intellectual functioning: Secondary | ICD-10-CM | POA: Diagnosis not present

## 2017-03-21 DIAGNOSIS — Z79899 Other long term (current) drug therapy: Secondary | ICD-10-CM | POA: Insufficient documentation

## 2017-03-21 DIAGNOSIS — Z0289 Encounter for other administrative examinations: Secondary | ICD-10-CM | POA: Diagnosis not present

## 2017-03-21 DIAGNOSIS — R748 Abnormal levels of other serum enzymes: Secondary | ICD-10-CM | POA: Diagnosis not present

## 2017-03-21 LAB — RAPID URINE DRUG SCREEN, HOSP PERFORMED
AMPHETAMINES: NOT DETECTED
Barbiturates: NOT DETECTED
Benzodiazepines: NOT DETECTED
Cocaine: POSITIVE — AB
Opiates: NOT DETECTED
Tetrahydrocannabinol: POSITIVE — AB

## 2017-03-21 LAB — COMPREHENSIVE METABOLIC PANEL
ALT: 126 U/L — ABNORMAL HIGH (ref 17–63)
AST: 105 U/L — ABNORMAL HIGH (ref 15–41)
Albumin: 4.6 g/dL (ref 3.5–5.0)
Alkaline Phosphatase: 102 U/L (ref 38–126)
Anion gap: 9 (ref 5–15)
BUN: 10 mg/dL (ref 6–20)
CO2: 27 mmol/L (ref 22–32)
Calcium: 9.2 mg/dL (ref 8.9–10.3)
Chloride: 100 mmol/L — ABNORMAL LOW (ref 101–111)
Creatinine, Ser: 1.17 mg/dL (ref 0.61–1.24)
GFR calc Af Amer: >60 mL/min (ref >60)
GFR calc non Af Amer: >60 mL/min (ref >60)
Glucose, Bld: 89 mg/dL (ref 65–99)
Potassium: 3.8 mmol/L (ref 3.5–5.1)
Sodium: 136 mmol/L (ref 135–145)
Total Bilirubin: 0.8 mg/dL (ref 0.3–1.2)
Total Protein: 7 g/dL (ref 6.5–8.1)

## 2017-03-21 LAB — CBC WITH DIFFERENTIAL/PLATELET
BASOS ABS: 0.1 10*3/uL (ref 0.0–0.1)
Basophils Relative: 1 %
EOS ABS: 0.2 10*3/uL (ref 0.0–0.7)
Eosinophils Relative: 2 %
HCT: 44.9 % (ref 39.0–52.0)
Hemoglobin: 15.2 g/dL (ref 13.0–17.0)
LYMPHS ABS: 2.4 10*3/uL (ref 0.7–4.0)
Lymphocytes Relative: 29 %
MCH: 29.7 pg (ref 26.0–34.0)
MCHC: 33.9 g/dL (ref 30.0–36.0)
MCV: 87.9 fL (ref 78.0–100.0)
Monocytes Absolute: 0.8 10*3/uL (ref 0.1–1.0)
Monocytes Relative: 9 %
NEUTROS PCT: 59 %
Neutro Abs: 4.8 10*3/uL (ref 1.7–7.7)
Platelets: 158 10*3/uL (ref 150–400)
RBC: 5.11 MIL/uL (ref 4.22–5.81)
RDW: 12.6 % (ref 11.5–15.5)
WBC: 8.3 10*3/uL (ref 4.0–10.5)

## 2017-03-21 LAB — ETHANOL: Alcohol, Ethyl (B): <5 mg/dL (ref <5)

## 2017-03-21 MED ORDER — LAMOTRIGINE 200 MG PO TABS
350.0000 mg | ORAL_TABLET | Freq: Two times a day (BID) | ORAL | Status: DC
  Administered 2017-03-21: 350 mg via ORAL
  Filled 2017-03-21: qty 1

## 2017-03-21 NOTE — ED Notes (Signed)
Patient Alert and oriented X4. Stable and ambulatory. Patient verbalized understanding of the discharge instructions.  Patient belongings were taken by the patient.  

## 2017-03-21 NOTE — ED Triage Notes (Addendum)
Pt took 3 grams of crack. He smoked it and was drinking alcohol. Smoked some pot too. He has autism and developmental disorders. Lives in independent apt. He is own guardian and father has HCPOA if needed. Dad knows he is here. At work at [employer]. Patient has team through [facility]. Pulse 69. States he started yest afternoon and has been using all night and into late morning. He states he feels normal. States whenever he has money he buys drugs prob every other day. He says he wants treatment. Team trying to get him into [facility].

## 2017-03-21 NOTE — Discharge Instructions (Signed)
Please read and follow all provided instructions.  Your diagnoses today include:  1. Polysubstance abuse     Tests performed today include:  Blood counts and electrolytes  Liver function test  Alcohol and drug screens  Vital signs. See below for your results today.   Medications prescribed:   None  Take any prescribed medications only as directed.  Home care instructions:  Follow any educational materials contained in this packet.  BE VERY CAREFUL not to take multiple medicines containing Tylenol (also called acetaminophen). Doing so can lead to an overdose which can damage your liver and cause liver failure and possibly death.   Follow-up instructions: Please follow-up with [facility] to continue to work on placement.   Return instructions:   Please return to the Emergency Department if you experience worsening symptoms.   Please return if you have any other emergent concerns.  Additional Information:  Your vital signs today were: BP 115/62 (BP Location: Left Arm)    Pulse 79    Temp 98.2 F (36.8 C) (Oral)    Resp 17    SpO2 98%  If your blood pressure (BP) was elevated above 135/85 this visit, please have this repeated by your doctor within one month. --------------

## 2017-03-21 NOTE — ED Notes (Signed)
Soda given to pt. Pt states he agrees to stay now. Community social worker/team with pt

## 2017-03-21 NOTE — ED Notes (Signed)
Pt up to desk conversing with staff. Telling jokes. Laughing.

## 2017-03-21 NOTE — ED Provider Notes (Signed)
[facility]-EMERGENCY DEPT Provider Note   CSN: [id number] Arrival date & time: 03/21/17  1411     History   Chief Complaint Chief Complaint  Patient presents with  . Drug Overdose    HPI Douglas Levine is a 26 y.o. male.  Patient with history of seizure disorder on Lamictal, drug abuse, autism, psoriasis on Humira -- presents to the emergency department with complaint of drug abuse. Patient is accompanied by his father who is his power of attorney. Patient reports heavy use of crack cocaine, marijuana, occasional alcohol. Her family has been coming to terms with his drug use and now is requesting help. He has people at [facility] helping him to find placement. Patient presents tonight for medical clearance purposes. States that last night he smoked 3 g of crack cocaine. He denies any symptoms from this. No chest pain or shortness of breath. No hallucinations. No associated seizures reported today. Patient has no acute medical complaints.       Past Medical History:  Diagnosis Date  . Autism   . Borderline intellectual disability   . Other specified pervasive developmental disorders, current or active state   . Seizure [facility](HCC)     Patient Active Problem List   Diagnosis Date Noted  . Autism spectrum disorder with accompanying intellectual impairment, requiring support (level 1) 06/21/2014  . Encounter for long-term (current) use of other medications 01/13/2013  . Generalized nonconvulsive epilepsy (HCC) 01/13/2013  . Generalized convulsive epilepsy (HCC) 01/13/2013  . Other specified pervasive developmental disorders, current or active state 01/13/2013    Past Surgical History:  Procedure Laterality Date  . CIRCUMCISION  1993       Home Medications    Prior to Admission medications   Medication Sig Start Date End Date Taking? Authorizing Provider  lamoTRIgine (LAMICTAL) 100 MG tablet Take 3 1/2 tablets twice daily approximately 12 hours apart Patient taking differently:  Take 350 mg by mouth every 12 (twelve) hours.  02/09/15   Deetta PerlaHickling, William H, MD  potassium chloride SA (K-DUR,KLOR-CON) 20 MEQ tablet Take 1 tablet (20 mEq total) by mouth 2 (two) times daily. 08/31/16   Rise MuLeaphart, Kenneth T, PA-C    Family History Family History  Problem Relation Age of Onset  . Other Mother        Mother was a slow learner was in special education until 6 th grade.  . Alcohol abuse Father        atient's father had a history of alcohol and drug abuse and behavior problems.   . Drug abuse Father     Social History Social History  Substance Use Topics  . Smoking status: Current Some Day Smoker    Packs/day: 0.50  . Smokeless tobacco: Never Used  . Alcohol use 0.0 oz/week     Comment: A few drinks a week     Allergies   Patient has no known allergies.   Review of Systems Review of Systems  Constitutional: Negative for diaphoresis and fever.  HENT: Negative for rhinorrhea and sore throat.   Eyes: Negative for redness.  Respiratory: Negative for cough and shortness of breath.   Cardiovascular: Negative for chest pain, palpitations and leg swelling.  Gastrointestinal: Negative for abdominal pain, diarrhea, nausea and vomiting.  Genitourinary: Negative for dysuria.  Musculoskeletal: Negative for back pain, myalgias and neck pain.  Skin: Negative for rash.  Neurological: Negative for syncope, light-headedness and headaches.  Psychiatric/Behavioral: The patient is not nervous/anxious.      Physical Exam Updated Vital  Signs BP 115/62 (BP Location: Left Arm)   Pulse 79   Temp 98.2 F (36.8 C) (Oral)   Resp 17   SpO2 98%   Physical Exam  Constitutional: He appears well-developed and well-nourished.  HENT:  Head: Normocephalic and atraumatic.  Mouth/Throat: Mucous membranes are normal. Mucous membranes are not dry.  Eyes: Conjunctivae are normal.  Neck: Trachea normal and normal range of motion. Neck supple. Normal carotid pulses and no JVD present. No  muscular tenderness present. Carotid bruit is not present. No tracheal deviation present.  Cardiovascular: Normal rate, regular rhythm, S1 normal, S2 normal, normal heart sounds and intact distal pulses.  Exam reveals no distant heart sounds and no decreased pulses.   No murmur heard. Pulmonary/Chest: Effort normal and breath sounds normal. No respiratory distress. He has no wheezes. He exhibits no tenderness.  Abdominal: Soft. Normal aorta and bowel sounds are normal. There is no tenderness. There is no rebound and no guarding.  Musculoskeletal: He exhibits no edema.  Neurological: He is alert.  Skin: Skin is warm and dry. He is not diaphoretic. No cyanosis. No pallor.  Psychiatric: He has a normal mood and affect.  Nursing note and vitals reviewed.    ED Treatments / Results  Labs (all labs ordered are listed, but only abnormal results are displayed) Labs Reviewed  COMPREHENSIVE METABOLIC PANEL - Abnormal; Notable for the following:       Result Value   Chloride 100 (*)    AST 105 (*)    ALT 126 (*)    All other components within normal limits  RAPID URINE DRUG SCREEN, HOSP PERFORMED - Abnormal; Notable for the following:    Cocaine POSITIVE (*)    Tetrahydrocannabinol POSITIVE (*)    All other components within normal limits  ETHANOL  CBC WITH DIFFERENTIAL/PLATELET    ED ECG REPORT   Date: 03/21/2017  Rate: 66  Rhythm: normal sinus rhythm  QRS Axis: right  Intervals: normal  ST/T Wave abnormalities: nonspecific ST changes  Conduction Disutrbances:none  Narrative Interpretation:   Old EKG Reviewed: unchanged  I have personally reviewed the EKG tracing and agree with the computerized printout as noted.  Radiology No results found.  Procedures Procedures (including critical care time)  Medications Ordered in ED Medications  lamoTRIgine (LAMICTAL) tablet 350 mg (350 mg Oral Given 03/21/17 1809)     Initial Impression / Assessment and Plan / ED Course  I have  reviewed the triage vital signs and the nursing notes.  Pertinent labs & imaging results that were available during my care of the patient were reviewed by me and considered in my medical decision making (see chart for details).     Patient seen and examined. Discussed work-up and results with patient and father. Offered referral for resources/social work advice here. They declined this stating that area people working on this. Will discharge to home with lab work. They will follow-up.  Vital signs reviewed and are as follows: BP 118/70   Pulse 80   Temp 98.2 F (36.8 C) (Oral)   Resp 19   SpO2 99%   Did discuss mildly elevated liver enzymes. Patient counseled to curtail use of alcohol and to follow-up with PCP regarding these. They may be related to other medications that he is taking.  Final Clinical Impressions(s) / ED Diagnoses   Final diagnoses:  Polysubstance abuse  Elevated liver enzymes   Patient with polysubstance abuse issues, looking for help. Family is assisting patient on getting help. Medically  no indications for admission or further work-up/treatment.    New Prescriptions Discharge Medication List as of 03/21/2017  8:00 PM       Renne Crigler, PA-C 03/21/17 2312    Mancel Bale, MD 03/22/17 2322

## 2017-04-07 ENCOUNTER — Encounter (HOSPITAL_BASED_OUTPATIENT_CLINIC_OR_DEPARTMENT_OTHER): Payer: Self-pay | Admitting: Emergency Medicine

## 2017-04-07 ENCOUNTER — Emergency Department (HOSPITAL_BASED_OUTPATIENT_CLINIC_OR_DEPARTMENT_OTHER)
Admission: EM | Admit: 2017-04-07 | Discharge: 2017-04-07 | Disposition: A | Discharge status: Home / Self Care | Payer: Medicare Other | Attending: Emergency Medicine | Admitting: Emergency Medicine

## 2017-04-07 DIAGNOSIS — G40909 Epilepsy, unspecified, not intractable, without status epilepticus: Secondary | ICD-10-CM

## 2017-04-07 DIAGNOSIS — F17200 Nicotine dependence, unspecified, uncomplicated: Secondary | ICD-10-CM | POA: Insufficient documentation

## 2017-04-07 DIAGNOSIS — R569 Unspecified convulsions: Secondary | ICD-10-CM | POA: Diagnosis present

## 2017-04-07 DIAGNOSIS — Z79899 Other long term (current) drug therapy: Secondary | ICD-10-CM | POA: Diagnosis not present

## 2017-04-07 DIAGNOSIS — F840 Autistic disorder: Secondary | ICD-10-CM | POA: Insufficient documentation

## 2017-04-07 HISTORY — DX: Alcohol abuse, uncomplicated: F10.10

## 2017-04-07 HISTORY — DX: Other psychoactive substance abuse, uncomplicated: F19.10

## 2017-04-07 HISTORY — DX: Schizoaffective disorder, unspecified: F25.9

## 2017-04-07 HISTORY — DX: Schizoaffective disorder, bipolar type: F25.0

## 2017-04-07 NOTE — ED Triage Notes (Addendum)
Called out for ? sz this am, has been at [facility] x 1 week. Per staff sat in a chair and got "stiff" and ? LOC briefly. Affect flat more than usual per staff. No sz activity noted at present.  . FSBS 119, BP 120/62, HR 80, R 18, O2 sat 96 % RA. Bit right side of tongue

## 2017-04-07 NOTE — ED Provider Notes (Signed)
[facility] Provider Note   CSN: [id number] Arrival date & time: 04/07/17  0802     History   Chief Complaint Chief Complaint  Patient presents with  . Seizures    HPI Douglas Levine is a 26 y.o. male.  HPI Patient is a Artist for drug rehabilitation. He reports that yesterday a very early every day. He reports this morning he was seated and had a seizure. He reports he is not sure what happened. Report from facility is that the patient stiffened and possibly had brief loss of consciousness. After that, reportedly patient's affect is more flat than typical. The patient reports he has intermittent seizures. He thinks the last one was about a month ago. He reports that he is compliant with his Lamictal and has already had his morning dose. From all the reasons he can think of that might precipitate breakthrough seizures, he identifies having to wake up early every day. Past Medical History:  Diagnosis Date  . Alcohol abuse   . Autism   . Borderline intellectual disability   . Drug abuse   . Other specified pervasive developmental disorders, current or active state   . Schizoaffective-type schizophrenia (HCC)   . Seizure [facility])     Patient Active Problem List   Diagnosis Date Noted  . Autism spectrum disorder with accompanying intellectual impairment, requiring support (level 1) 06/21/2014  . Encounter for long-term (current) use of other medications 01/13/2013  . Generalized nonconvulsive epilepsy (HCC) 01/13/2013  . Generalized convulsive epilepsy (HCC) 01/13/2013  . Other specified pervasive developmental disorders, current or active state 01/13/2013    Past Surgical History:  Procedure Laterality Date  . CIRCUMCISION  1993       Home Medications    Prior to Admission medications   Medication Sig Start Date End Date Taking? Authorizing Provider  lamoTRIgine (LAMICTAL) 100 MG tablet Take 3 1/2 tablets twice daily approximately 12 hours apart Patient  taking differently: Take 350 mg by mouth every 12 (twelve) hours.  02/09/15  Yes Deetta Perla, MD  potassium chloride SA (K-DUR,KLOR-CON) 20 MEQ tablet Take 1 tablet (20 mEq total) by mouth 2 (two) times daily. 08/31/16   Rise Mu, PA-C    Family History Family History  Problem Relation Age of Onset  . Other Mother        Mother was a slow learner was in special education until 6 th grade.  . Alcohol abuse Father        atient's father had a history of alcohol and drug abuse and behavior problems.   . Drug abuse Father     Social History Social History  Substance Use Topics  . Smoking status: Current Some Day Smoker    Packs/day: 0.50  . Smokeless tobacco: Never Used  . Alcohol use 0.0 oz/week     Comment: A few drinks a week     Allergies   Patient has no known allergies.   Review of Systems Review of Systems  10 Systems reviewed and are negative for acute change except as noted in the HPI.  Physical Exam Updated Vital Signs BP 110/61   Pulse 76   Temp 98 F (36.7 C) (Oral)   Resp 15   Ht  (1.854 m)   Wt 76.2 kg (168 lb)   SpO2 100%   BMI 22.16 kg/m   Physical Exam  Constitutional: He is oriented to person, place, and time. He appears well-developed and well-nourished.  HENT:  Head:  Normocephalic and atraumatic.  Nose: Nose normal.  Patient has laceration to the right lateral tongue consistent with a bite. No bleeding. Posterior airway widely patent. Dentition intact.  Eyes: Pupils are equal, round, and reactive to light. Conjunctivae and EOM are normal.  Neck: Neck supple.  Cardiovascular: Normal rate and regular rhythm.   No murmur heard. Pulmonary/Chest: Effort normal and breath sounds normal. No respiratory distress.  Abdominal: Soft. There is no tenderness.  Musculoskeletal: Normal range of motion. He exhibits no edema, tenderness or deformity.  Neurological: He is alert and oriented to person, place, and time. No cranial nerve  deficit. He exhibits normal muscle tone. Coordination normal.  Skin: Skin is warm and dry.  Psoriatic rash on hands.  Psychiatric: He has a normal mood and affect.  Nursing note and vitals reviewed.     ED Treatments / Results  Labs (all labs ordered are listed, but only abnormal results are displayed) Labs Reviewed - No data to display  EKG  EKG Interpretation None       Radiology No results found.  Procedures Procedures (including critical care time)  Medications Ordered in ED Medications - No data to display   Initial Impression / Assessment and Plan / ED Course  I have reviewed the triage vital signs and the nursing notes.  Pertinent labs & imaging results that were available during my care of the patient were reviewed by me and considered in my medical decision making (see chart for details).      Final Clinical Impressions(s) / ED Diagnoses   Final diagnoses:  Seizure disorder [facility])   Patient has known seizure disorder. At this time he has had self-limited brief seizure. Patient is compliant reportedly with his Lamictal. He is not acutely ill in appearance with other likely etiologies. At this time I feel patient is stable for discharge with follow-up with his primary care provider and neurologist for any consideration of medication dose adjustments. New Prescriptions New Prescriptions   No medications on file     Arby Barrette, MD 04/07/17 [phone]

## 2017-04-07 NOTE — ED Notes (Signed)
Pt declined refreshments.

## 2017-04-07 NOTE — ED Notes (Signed)
Called report to [place] [facility] , states will send someone to pick up from waiting room

## 2017-05-28 ENCOUNTER — Encounter (INDEPENDENT_AMBULATORY_CARE_PROVIDER_SITE_OTHER): Payer: Self-pay | Admitting: Pediatrics

## 2017-05-28 ENCOUNTER — Other Ambulatory Visit: Payer: Self-pay

## 2017-05-28 ENCOUNTER — Ambulatory Visit (INDEPENDENT_AMBULATORY_CARE_PROVIDER_SITE_OTHER): Payer: Medicaid Other | Admitting: Pediatrics

## 2017-05-28 VITALS — BP 130/74 | HR 84 | Ht 73.0 in | Wt 178.0 lb

## 2017-05-28 DIAGNOSIS — G40309 Generalized idiopathic epilepsy and epileptic syndromes, not intractable, without status epilepticus: Secondary | ICD-10-CM

## 2017-05-28 DIAGNOSIS — F840 Autistic disorder: Secondary | ICD-10-CM

## 2017-05-28 MED ORDER — LAMOTRIGINE 100 MG PO TABS: ORAL_TABLET | ORAL | 5 refills | Status: DC
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
## 2017-07-12 ENCOUNTER — Telehealth (INDEPENDENT_AMBULATORY_CARE_PROVIDER_SITE_OTHER): Payer: Self-pay | Admitting: Pediatrics
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
## 2017-07-12 NOTE — Telephone Encounter (Signed)
°  Who's calling (name and relationship to patient) : Molly MaduroRobert (Father) Best contact number: [phone] Provider they see: Dr. Sharene SkeansHickling  Reason for call: Per dad, pt had seizure on Jan. 1, 2019 and has had several others prior. Dad wants to consult with Dr. Sharene SkeansHickling regarding pt's drug addiction, alcohol use, and persistent seizures. Dad wants to know if there is anything more that can be done to help pt.

## 2017-07-15 NOTE — Telephone Encounter (Signed)
I spoke with Douglas Levine his father although is an adult, I probably cannot do so officially.  The provided information to me concerning mixed recurrent seizures his unwillingness to take medication his use of cocaine, alcohol, and marijuana.  He is trying to get custody of his son.  I told him that I would be unable to have [name].  This would have to be a voluntary commitment.  I do not disagree that he is failing to act in his own best interest by failing to take his medication, and his drug and alcohol use.  He is definitely at risk for sudden unexplained death in epileptic patients as well as episodes of status epilepticus.  I recommended that he speak with a lawyer concerning trying to gain guardianship and have his son declared incompetent.  I would likely have to see the neck again to reassess him before writing any notes concerning his competency.

## 2017-12-20 ENCOUNTER — Emergency Department (HOSPITAL_COMMUNITY)
Admission: EM | Admit: 2017-12-20 | Discharge: 2017-12-20 | Disposition: A | Discharge status: Home / Self Care | Payer: Medicare Other | Attending: Emergency Medicine | Admitting: Emergency Medicine

## 2017-12-20 ENCOUNTER — Other Ambulatory Visit (INDEPENDENT_AMBULATORY_CARE_PROVIDER_SITE_OTHER): Payer: Self-pay | Admitting: Pediatrics

## 2017-12-20 ENCOUNTER — Emergency Department (HOSPITAL_COMMUNITY): Payer: Medicare Other

## 2017-12-20 ENCOUNTER — Other Ambulatory Visit: Payer: Self-pay

## 2017-12-20 ENCOUNTER — Encounter (HOSPITAL_COMMUNITY): Payer: Self-pay

## 2017-12-20 DIAGNOSIS — Y939 Activity, unspecified: Secondary | ICD-10-CM | POA: Diagnosis not present

## 2017-12-20 DIAGNOSIS — G40309 Generalized idiopathic epilepsy and epileptic syndromes, not intractable, without status epilepticus: Secondary | ICD-10-CM

## 2017-12-20 DIAGNOSIS — Y998 Other external cause status: Secondary | ICD-10-CM | POA: Diagnosis not present

## 2017-12-20 DIAGNOSIS — F840 Autistic disorder: Secondary | ICD-10-CM | POA: Insufficient documentation

## 2017-12-20 DIAGNOSIS — S0501XA Injury of conjunctiva and corneal abrasion without foreign body, right eye, initial encounter: Secondary | ICD-10-CM

## 2017-12-20 DIAGNOSIS — S02839A Fracture of medial orbital wall, unspecified side, initial encounter for closed fracture: Secondary | ICD-10-CM

## 2017-12-20 DIAGNOSIS — S0990XA Unspecified injury of head, initial encounter: Secondary | ICD-10-CM | POA: Diagnosis present

## 2017-12-20 DIAGNOSIS — Z23 Encounter for immunization: Secondary | ICD-10-CM | POA: Insufficient documentation

## 2017-12-20 DIAGNOSIS — F17200 Nicotine dependence, unspecified, uncomplicated: Secondary | ICD-10-CM | POA: Insufficient documentation

## 2017-12-20 DIAGNOSIS — Z79899 Other long term (current) drug therapy: Secondary | ICD-10-CM | POA: Insufficient documentation

## 2017-12-20 DIAGNOSIS — S0280XA Fracture of other specified skull and facial bones, unspecified side, initial encounter for closed fracture: Secondary | ICD-10-CM | POA: Diagnosis not present

## 2017-12-20 DIAGNOSIS — Y929 Unspecified place or not applicable: Secondary | ICD-10-CM | POA: Insufficient documentation

## 2017-12-20 MED ORDER — HYDROCODONE-ACETAMINOPHEN 5-325 MG PO TABS
1.0000 | ORAL_TABLET | Freq: Once | ORAL | Status: AC
  Administered 2017-12-20: 1 via ORAL
  Filled 2017-12-20: qty 1

## 2017-12-20 MED ORDER — ONDANSETRON 4 MG PO TBDP
4.0000 mg | ORAL_TABLET | Freq: Once | ORAL | Status: AC
  Administered 2017-12-20: 4 mg via ORAL
  Filled 2017-12-20: qty 1

## 2017-12-20 MED ORDER — ERYTHROMYCIN 5 MG/GM OP OINT: TOPICAL_OINTMENT | OPHTHALMIC | 0 refills | Status: DC

## 2017-12-20 MED ORDER — HYDROCODONE-ACETAMINOPHEN 5-325 MG PO TABS: 1.0000 | ORAL_TABLET | Freq: Four times a day (QID) | ORAL | 0 refills | Status: DC | PRN

## 2017-12-20 MED ORDER — LIDOCAINE HCL (PF) 1 % IJ SOLN
5.0000 mL | Freq: Once | INTRAMUSCULAR | Status: AC
  Administered 2017-12-20: 5 mL via INTRADERMAL
  Filled 2017-12-20: qty 30

## 2017-12-20 MED ORDER — AMOXICILLIN-POT CLAVULANATE 875-125 MG PO TABS: 1.0000 | ORAL_TABLET | Freq: Two times a day (BID) | ORAL | 0 refills | Status: DC

## 2017-12-20 MED ORDER — FLUORESCEIN SODIUM 1 MG OP STRP
1.0000 | ORAL_STRIP | Freq: Once | OPHTHALMIC | Status: AC
  Administered 2017-12-20: 1 via OPHTHALMIC
  Filled 2017-12-20: qty 1

## 2017-12-20 MED ORDER — TETANUS-DIPHTH-ACELL PERTUSSIS 5-2.5-18.5 LF-MCG/0.5 IM SUSP
0.5000 mL | Freq: Once | INTRAMUSCULAR | Status: AC
  Administered 2017-12-20: 0.5 mL via INTRAMUSCULAR
  Filled 2017-12-20: qty 0.5

## 2017-12-20 MED ORDER — LAMOTRIGINE 25 MG PO TABS
350.0000 mg | ORAL_TABLET | Freq: Once | ORAL | Status: AC
  Administered 2017-12-20: 350 mg via ORAL
  Filled 2017-12-20: qty 2

## 2017-12-20 MED ORDER — ERYTHROMYCIN 5 MG/GM OP OINT
TOPICAL_OINTMENT | Freq: Once | OPHTHALMIC | Status: AC
  Administered 2017-12-20: 1 via OPHTHALMIC
  Filled 2017-12-20: qty 3.5

## 2017-12-20 MED ORDER — IBUPROFEN 800 MG PO TABS
800.0000 mg | ORAL_TABLET | Freq: Once | ORAL | Status: AC
  Administered 2017-12-20: 800 mg via ORAL
  Filled 2017-12-20: qty 1

## 2017-12-20 MED ORDER — TETRACAINE HCL 0.5 % OP SOLN
2.0000 [drp] | Freq: Once | OPHTHALMIC | Status: AC
  Administered 2017-12-20: 2 [drp] via OPHTHALMIC
  Filled 2017-12-20: qty 4
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
## 2017-12-20 NOTE — Discharge Instructions (Addendum)
Please go to Dr. Ranae PilaLiles office today at 1:00.  You will need to also follow-up with Dr. Jenne PaneBates for your orbital wall fracture.  Contact a health care provider if: Your vision changes. You see two of a single object (double vision). You feel pain when you move your eyes. The redness or swelling around the injured eye does not go away or gets worse. Blood or discolored discharge comes from your nose. You have a fever. Get help right away if: You have a sensation that you are seeing flashing lights. You have sudden blindness.

## 2017-12-20 NOTE — Progress Notes (Signed)
CSW received consult for patient needing transportation to eye doctors appointment at 1PM this afternoon. CSW spoke with patients father, Molly MaduroRobert, who is agreeable to pick patient up and provide transportation to doctors appointment. Patients father currently en route to pick patient up.   Stacy GardnerErin Male Levine, [facility] Regional Medical CenterCSWA Emergency Room Clinical Social Worker [phone]

## 2017-12-20 NOTE — ED Notes (Signed)
Social work made arrangements for transport with patient to get to his eye appointment and then back home.
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
## 2018-05-28 ENCOUNTER — Other Ambulatory Visit (INDEPENDENT_AMBULATORY_CARE_PROVIDER_SITE_OTHER): Payer: Self-pay | Admitting: Pediatrics

## 2018-05-28 DIAGNOSIS — G40309 Generalized idiopathic epilepsy and epileptic syndromes, not intractable, without status epilepticus: Secondary | ICD-10-CM

## 2018-08-08 ENCOUNTER — Other Ambulatory Visit (INDEPENDENT_AMBULATORY_CARE_PROVIDER_SITE_OTHER): Payer: Self-pay | Admitting: Pediatrics

## 2018-08-08 DIAGNOSIS — G40309 Generalized idiopathic epilepsy and epileptic syndromes, not intractable, without status epilepticus: Secondary | ICD-10-CM

## 2019-08-04 ENCOUNTER — Ambulatory Visit (INDEPENDENT_AMBULATORY_CARE_PROVIDER_SITE_OTHER): Payer: Medicare Other | Admitting: Pediatrics

## 2019-08-04 ENCOUNTER — Other Ambulatory Visit: Payer: Self-pay

## 2019-08-04 ENCOUNTER — Encounter (INDEPENDENT_AMBULATORY_CARE_PROVIDER_SITE_OTHER): Payer: Self-pay | Admitting: Pediatrics

## 2019-08-04 VITALS — BP 108/78 | HR 64 | Ht 73.0 in | Wt 176.6 lb

## 2019-08-04 DIAGNOSIS — F840 Autistic disorder: Secondary | ICD-10-CM | POA: Diagnosis not present

## 2019-08-04 DIAGNOSIS — G40309 Generalized idiopathic epilepsy and epileptic syndromes, not intractable, without status epilepticus: Secondary | ICD-10-CM

## 2019-08-04 MED ORDER — LAMOTRIGINE 100 MG PO TABS: ORAL_TABLET | ORAL | 5 refills | Status: DC

## 2019-08-04 NOTE — Patient Instructions (Signed)
I am pleased that you are doing so well.  I would like to see you in a year.  We will refill your prescription in 6 months.  Let me know if I can be of further assistance.

## 2019-08-04 NOTE — Progress Notes (Signed)
Patient: Douglas Levine MRN: [id number] Sex: male DOB: [birth year]  Provider: Ellison Carwin, MD Location of Care: [facility] Child Neurology  Note type: Routine return visit  History of Present Illness: Referral Source: [facility] History from: guardian, patient and [facility] chart Chief Complaint: Epilepsy/Autism Spectrum Disorder  Douglas Levine is a 29 y.o. male who returns for evaluation of August 04, 2019 for the first time since May 28, 2017.  He has high functioning autism with borderline electro disability and preserved language.  He has juvenile absence epilepsy.  When Douglas Levine is compliant with his medication, he is seizure-free.  When he is not, seizures recur.  He lived with his father until he was 38.  He moved out of his home at that time living in apartments or with acquaintances.  His life was chaotic and he had frequent seizures.  Sometimes this was associated with the use of alcohol which he had been told not to do.  He was placed in an inpatient rehabilitation facility on a voluntary basis, moved back into his father's home temporarily but the long-term plan was an [facility].  He has been in [facility] for at least a year.  While he has been there, there have been no seizures because he gets adequate sleep and he is compliant with medication.  In addition he does not consume consume alcohol.  His general health is good.  His weight is virtually identical over the past 27 months.  He is hoping to be also for his driver's license.  I told him that if he could pass the tests and find a source of insurance and a car, that it was possible.  His supervisor was not happy with this advice.  I told her that Douglas Levine had to get over a lot of barriers in order to be able to do that.  To tell him that he could not obtain a driver's license was factually incorrect but was highly unlikely.  He does not have a job.  Remaining gainfully employed on a long-term basis has not  happened as an adult.  He goes to bed between 10 PM and 10:30 PM, falls asleep within 1/2-hour and sleeps until 7 AM.  He has a day worker who helps him get in the community.  He comes home and watches movies.  He goes to [employer] 2 days a week for 5 hours at a time to continue his education.  Fortunately because of his supervision, he wears a mask in public, practices social distancing, and has been Covid free.  No other concerns were raised today other than the need to refill his antiepileptic medication.  Review of Systems: A complete review of systems was remarkable for patient is here to be seen for epilepsy and autism. Patient reports that he has not had any seizures since his last visit. He states that he has no concerns at this time., all other systems reviewed and negative.  Past Medical History Diagnosis Date  . Alcohol abuse   . Autism   . Borderline intellectual disability   . Drug abuse (HCC)   . Other specified pervasive developmental disorders, current or active state   . Schizoaffective-type schizophrenia (HCC)   . Seizure (HCC)    Hospitalizations: No., Head Injury: No., Nervous System Infections: No., Immunizations up to date: Yes.    Copied from prior chart He had MRI and EEG in [place] before the family moved to this area.  On evaluation December 10, 1995 he was noted to have a behavior disorder, and developmental delay. He had a nonfocal neurological examination. EEG performed On May 17, 2006 showed episodes of 6-10 seconds of 2-1/2 Hz 400 V spike and slow-wave activity was regularly contoured. He was poorly responsive during this time. He also had frontally predominant Isolated spike and slow-wave discharges. Background was otherwise normal.  He was initially placed on Zonegran on May 23, 2006.  ER visit around October of 2015 due to seizure activity.  May' emerging health problem has been psoriasis on his arms. He has been to  see a dermatologist, who has tried a variety of topical agents. It may be necessary to use immune therapy, which carries its own risks.  ER visits due to seizure activity.  Birth History Adopted in 1993 6 lbs. 6 oz. Infant born following an uncomplicated pregnancy except mother received no prenatal care. She will is a 28 year old primigravida. Gestation was complicated by nausea and the first trimester. The patient had limited use of alcohol no use of cigarettes or drugs during pregnancy.  In the nursery he had a poor suck, and jaundice treated with the phototherapy. He remained for 5 days. The patient wanted 13-15 months, talk to 3 years and became toilet trained by about 4.  Behavior History Autism spectrum disorder, level 1  Surgical History Procedure Laterality Date  . CIRCUMCISION  1993   Family History family history includes Alcohol abuse in his father; Drug abuse in his father; Other in his mother. Family history is negative for migraines, seizures, intellectual disabilities, blindness, deafness, birth defects, chromosomal disorder, or autism.  Social History Socioeconomic History  . Marital status: Single  . Years of education:  74  . Highest education level:  High school certificate  Occupational History  . Not employed  Tobacco Use  . Smoking status: Current Some Day Smoker    Packs/day: 0.50  . Smokeless tobacco: Never Used  Substance and Sexual Activity  . Alcohol use: Yes    Alcohol/week: 0.0 standard drinks    Comment: A few drinks a week  . Drug use: Yes    Types: Cocaine  . Sexual activity: Yes    Partners: Female    Birth control/protection: Condom  Social History Narrative    Douglas Levine is a 29 yo male.    He attends [name].    He is Glass blower/designer in Geologist, engineering.    He lives with his AF mom. He has two siblings.   No Known Allergies  Physical Exam BP 108/78   Pulse 64   Ht 6\' 1"  (1.854 m)   Wt 176 lb 9.6 oz (80.1 kg)    BMI 23.30 kg/m   General: alert, well developed, well nourished, in no acute distress, blond hair, blue eyes, left handed Head: normocephalic, no dysmorphic features, bushy beard Ears, Nose and Throat: Otoscopic: tympanic membranes normal; pharynx: oropharynx is pink without exudates or tonsillar hypertrophy Neck: supple, full range of motion, no cranial or cervical bruits Respiratory: auscultation clear Cardiovascular: no murmurs, pulses are normal Musculoskeletal: no skeletal deformities or apparent scoliosis Skin: no rashes or neurocutaneous lesions  Neurologic Exam  Mental Status: alert; oriented to person; knowledge is normal for age; language is normal; flat affect, intermittent eye contact Cranial Nerves: visual fields are full to double simultaneous stimuli; extraocular movements are full and conjugate; pupils are round reactive to light; funduscopic examination shows sharp disc margins with normal vessels; symmetric facial strength; midline tongue and uvula; air conduction is  greater than bone conduction bilaterally Motor: Normal strength, tone and mass; good fine motor movements; no pronator drift Sensory: intact responses to cold, vibration, proprioception and stereognosis Coordination: good finger-to-nose, rapid repetitive alternating movements and finger apposition Gait and Station: normal gait and station: patient is able to walk on heels, toes and tandem without difficulty; balance is adequate; Romberg exam is negative; Gower response is negative Reflexes: symmetric and diminished bilaterally; no clonus; bilateral flexor plantar responses  Assessment 1.  Generalized convulsive epilepsy, G40.309. 2.  Autism spectrum disorder with accompanying intellectual i impairment, requiring support (level one), F84.0.  Discussion I am pleased that Douglas Levine is doing well, that he safe and secure.  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  I want to empower him to try to do things, but since I see him so infrequently I have very little influence.  Plan I refilled his prescription for lamotrigine.  I asked him to return to see me in a year.  I will refill it again in 6 months' time.  Greater than 50% of a 25-minute visit was spent in counseling coordination of care concerning his seizures, his autism, his current living situation, and discussing driving.   Medication List    TAKE these medications   Humira Pen 40 MG/0.8ML Pnkt Generic drug: Adalimumab INJECT 1 PEN Q 2 WEEKS   lamoTRIgine 100 MG tablet Commonly known as: LAMICTAL TAKE 3 AND 1/2 TABLETS BY MOUTH TWICE DAILY 12 HOURS APART    The medication list was reviewed and reconciled. All changes or newly prescribed medications were explained.  A complete medication list was provided to the patient/caregiver.  Jodi Geralds MD

## 2019-08-05 ENCOUNTER — Ambulatory Visit (INDEPENDENT_AMBULATORY_CARE_PROVIDER_SITE_OTHER): Payer: Medicaid Other | Admitting: Pediatrics

## 2019-09-21 ENCOUNTER — Telehealth (INDEPENDENT_AMBULATORY_CARE_PROVIDER_SITE_OTHER): Payer: Self-pay | Admitting: Pediatrics

## 2019-09-21 NOTE — Telephone Encounter (Signed)
Who's calling (name and relationship to patient) : Douglas Levine (dad)  Best contact number: [phone]  Provider they see: Dr. [name]  Reason for call:  [name] has been seizure free for 2 years, he is living at an alternate living home that aids with his autism and epilepsy needs.  [name] [facility] [employer] that helps with daily living skills and abilities. [name] is wanting an Producer, television/film/video but dad/ facility needs a letter stating whether Dr. [name] feels like this is something [name] can handle, dad states that at one point Dr. [name] advised for [name] to not ride a bicycle due to his condition of epilepsy. Dad said this is along the lines of that as well, whether Dr. [name] thought it would be okay/safe. Please send to dads email address [email or web address] with approval or denial letter. Dad requesting a call as well if Dr. Merleen Milliner has any further questions or concerns.    Call ID:      PRESCRIPTION REFILL ONLY  Name of prescription:  Pharmacy:

## 2019-09-21 NOTE — Telephone Encounter (Signed)
Dictated and given to Tiffanie for disposition.

## 2019-09-21 NOTE — Telephone Encounter (Signed)
Dad called to ask if the letter can be updated to "electric skateboard" and not electric stapler. I explained that the phone message stated he needed an electric stapler.

## 2019-09-22 ENCOUNTER — Encounter (INDEPENDENT_AMBULATORY_CARE_PROVIDER_SITE_OTHER): Payer: Self-pay | Admitting: Pediatrics

## 2019-09-22 NOTE — Telephone Encounter (Signed)
Letter has been rewritten.

## 2019-09-23 NOTE — Telephone Encounter (Signed)
Dad called requesting to know where the letter was. He requests it be emailed to him. [email or web address]

## 2019-09-23 NOTE — Telephone Encounter (Signed)
Letter has been scanned and sent to Mr. Disch

## 2019-09-23 NOTE — Telephone Encounter (Signed)
Thanks for sending it.
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
## 2020-02-22 ENCOUNTER — Telehealth (INDEPENDENT_AMBULATORY_CARE_PROVIDER_SITE_OTHER): Payer: Self-pay | Admitting: Pediatrics

## 2020-02-22 NOTE — Telephone Encounter (Signed)
  Who's calling (name and relationship to patient) : Brousseau,Robert Best contact number: [phone] Provider they see: [name] Reason for call: Dad called to inform our office that Social Security has not received the records that were requested for Douglas Levine's benefits.  This was originally  faxed to [employer] on 6/25 and then resubmitted today.  Dad was made aware.    PRESCRIPTION REFILL ONLY  Name of prescription:  Pharmacy:

## 2020-02-22 NOTE — Telephone Encounter (Signed)
It looks like the forms are in media but were not signed. I have printed them so that you can sign them when you return

## 2020-02-23 NOTE — Telephone Encounter (Signed)
The front office takes care of this.  It does not require my signature.

## 2020-05-05 ENCOUNTER — Telehealth (INDEPENDENT_AMBULATORY_CARE_PROVIDER_SITE_OTHER): Payer: Self-pay | Admitting: Pediatrics

## 2020-05-05 DIAGNOSIS — G40309 Generalized idiopathic epilepsy and epileptic syndromes, not intractable, without status epilepticus: Secondary | ICD-10-CM

## 2020-05-05 NOTE — Telephone Encounter (Signed)
  Who's calling (name and relationship to patient) : robert ( dad)  Best contact number:[phone]  Provider they see: Dr. [name]  Reason for call: Layer of protection for seizures wanted to know if there is anything on the market that can let them know when  Seizure is coming?. I did let dad know he is coming up for his yearly in January if we could get him scheduled for that     PRESCRIPTION REFILL ONLY  Name of prescription:  Pharmacy:

## 2020-05-08 NOTE — Telephone Encounter (Signed)
There is nothing to predict seizures.  The best way to prevent seizures is to take medication as prescribed, avoid illness, get good sleep, and avoid elicit medications and alcohol.   Lorenz Coaster MD MPH

## 2020-05-09 NOTE — Telephone Encounter (Signed)
Dad is aware that Dr. [name] is out of the office at this time. I informed him that he would not be back until Wednesday morning. The message that was given to me stated that dad would like to know if there is any type of technology out here that can alert them when a seizure is happening. He was not asking about predictions of when a seizure can happen. He states that he would like to talk to Dr. [name] and no one else.

## 2020-05-11 NOTE — Telephone Encounter (Signed)
20-minute discussion.  I am unaware of a device that will unambiguously distinguish seizure activity from restless sleep.  Aware that there are some wrist devices that support to detect seizures I do not know there is sensitivity and specificity.  The most sensitive and specific way would be to get an infrared camera and video him while he sleeps.  I think it is very likely that he has not gone 2 years without any seizures when he went over to his father's house 1 night recently and had a witnessed seizure.  At risk is our failure to treat seizures and not being able to identify the frequency and severity of them the other possibility is a sudden unexplained death in epileptic patients.  There is 2 reasons to me would be a compelling argument for invasion of his privacy.  I told father to write a letter but I cannot tell him that I will be able to do it on a timely basis.

## 2020-05-12 NOTE — Telephone Encounter (Signed)
I spoke with father who wants to purchase an [place] watch.  I told him the sensitivity and specificity of the watch was dubious but I have no problem writing the order.  He is very concerned about his son having more nocturnal seizures than we are aware of.  I also suggested that he speak with his son and see if he can get his son to agree that placing a camera in the room would be useful.  If the son agrees, I would not see why the AFL would reject it.  I have written the order and we will mail it.

## 2020-05-12 NOTE — Telephone Encounter (Signed)
Letter has been placed up front to be mailed. 

## 2020-05-12 NOTE — Telephone Encounter (Signed)
Dad called back in requesting to speak with Dr. [name] per their last conversation on 11/03. Dad states the name of the seizure watch is Kermit Balo, would like to know if this is something that Dr. [name] can Clinical research associate a prescription for as it requires one. Please advise.

## 2020-06-08 NOTE — Telephone Encounter (Signed)
I have remailed the prescription for the [facility] watch. Now dad is requesting a recommendation for a camera.Please advise

## 2020-06-08 NOTE — Telephone Encounter (Signed)
Douglas Levine called in this afternoon. He was questioning how we were able to talk with his dad. I was able to locate his dads guardianship paperwork that was under media, not documents where I originally was looking. Writer called [name] per Dr.Hicklings previous note to make sure I was relaying correct information to PT and what to tell PT. Writer explained to [name] that since dad does have legal guardianship we are able to speak with him, the letter that was written was medical advice, that he would need to speak with his dad about his concerns regarding the camera and/or watch. [name] stated he did not feel comfortable with a camera in his room but might be okay with a watch as he was not sure why dad felt like he needed to be watch when he was not having seizure like dad said/thought he was. [name] did verbalize understanding of why we were able to speak with dad and that if PT wanted to speak with [place] that he could call at any time.

## 2020-06-08 NOTE — Telephone Encounter (Addendum)
I spoke with the patient's father. I have written a letter at his request to advocate for the infrared camera placed in his room so that we can look for nocturnal seizures. We have documentation that he is the guardian for Douglas Levine but we do not have a two way release. I do not know if this precludes [place] from sending a signed letter by scanning into the chart descending with email. I have asked Tiffanie to check with office administration to find out about this. Father may need to come in to sign a to a release and at that time he can pick up the signed letter which I have dictated and will sign on Thursday morning as soon as I arrive.

## 2020-06-08 NOTE — Telephone Encounter (Signed)
Clemetine Marker which is Douglas Levine's AFL provider called in the office. Ms. Douglas Levine stated she was notified by Douglas Levine's dad that he would be getting the camera for his room but that Douglas Levine was very uncomfortable and did not want the camera. Douglas Levine lives with Douglas Levine but dad is legal guardian. We do not have release forms on file for either dad nor Douglas Levine. Writer notified Douglas Levine we would have to have that form completed and signed before we could release or speak with either of them but we could speak with Douglas Levine himself. Ms. Douglas Levine stated that Douglas Levine was at school but would call office to speak with [place] regarding this matter when he got home.   Clemetine Marker AFL Provider [phone]

## 2020-06-08 NOTE — Telephone Encounter (Signed)
Dad called stating that he never got the letters. Address was discussed. The previous address was incorrect. The address has been updated. Dad would like the letters mailed to him again. Dad would also like them emailed to him if that is a possibility. Dad would like a call back to discuss best way to get letters as soon as possible.   Dad states that he needs two things.    One prescription for Embrace2 and one recommendation for a camera from Dr. [name].

## 2020-06-08 NOTE — Telephone Encounter (Signed)
We have been through this issue this morning concerning his father.  His father is the legal guardian and does not need a 2 way consent.  I do not know what the process will be for Ms. Williard.  I will not be speaking to her until she has permission granted by father to speak to me.  Father is made this decision for his son and has gone through the doctor supervising the AFL who agrees with this plan.  I have written the letter which I will sign in the morning to move forward with this.  If there are any questions about this, they should go through Ryan who is aware of the situation.

## 2020-06-09 ENCOUNTER — Telehealth (INDEPENDENT_AMBULATORY_CARE_PROVIDER_SITE_OTHER): Payer: Self-pay | Admitting: Pediatrics

## 2020-06-09 NOTE — Telephone Encounter (Signed)
  Who's calling (name and relationship to patient) : Molly Maduro (dad)  Best contact number: [phone]  Provider they see: Dr. [name]  Reason for call: Dad would like to set up a conference call between Dr. [name], himself and another care provider. Requests call back with best day and time.    PRESCRIPTION REFILL ONLY  Name of prescription:  Pharmacy:

## 2020-06-09 NOTE — Telephone Encounter (Signed)
I called father and agreed to speak with Frederich Chick if they can set up a time when I can discuss this.

## 2020-06-10 ENCOUNTER — Telehealth (INDEPENDENT_AMBULATORY_CARE_PROVIDER_SITE_OTHER): Payer: Self-pay | Admitting: Pediatrics

## 2020-06-10 NOTE — Telephone Encounter (Signed)
Spoke with father. Letter has been scanned and emailed to Douglas Levine as requested

## 2020-06-10 NOTE — Telephone Encounter (Signed)
°  Who's calling (name and relationship to patient) : Molly Maduro (dad)  Best contact number: [phone]  Provider they see: Dr. [name]  Reason for call: Dad states that Dr. [name] was going to write a letter about having a camera in his son's room to monitor for seizures - he wants to know if the letter is complete and ready to be picked up.    PRESCRIPTION REFILL ONLY  Name of prescription:  Pharmacy:

## 2020-06-16 NOTE — Telephone Encounter (Signed)
I returned dad's call.  Is going through Company secretary and a Clinical research associate.  I will help any way that I can.  There is nothing to prevent necklace from turning the camera off or disabling the computer but if he does that his father has threatened to place him on a very high level of observation which will be very intrusive.

## 2020-06-16 NOTE — Telephone Encounter (Signed)
Dad Molly Maduro) has called back regarding this - requests call back from Dr. [name] at [phone].

## 2020-06-16 NOTE — Telephone Encounter (Signed)
Spoke with father about his phone message. He states that he has obtained legal counsel due to the patient refusing to have the camera in his room. He states that he would like to talk further about this.

## 2020-07-13 ENCOUNTER — Telehealth (INDEPENDENT_AMBULATORY_CARE_PROVIDER_SITE_OTHER): Payer: Self-pay | Admitting: Pediatrics

## 2020-07-13 ENCOUNTER — Other Ambulatory Visit: Payer: Self-pay

## 2020-07-13 ENCOUNTER — Ambulatory Visit (INDEPENDENT_AMBULATORY_CARE_PROVIDER_SITE_OTHER): Payer: Medicare Other | Admitting: Pediatrics

## 2020-07-13 ENCOUNTER — Encounter (INDEPENDENT_AMBULATORY_CARE_PROVIDER_SITE_OTHER): Payer: Self-pay | Admitting: Pediatrics

## 2020-07-13 VITALS — BP 124/78 | HR 80 | Ht 73.0 in | Wt 171.2 lb

## 2020-07-13 DIAGNOSIS — G40309 Generalized idiopathic epilepsy and epileptic syndromes, not intractable, without status epilepticus: Secondary | ICD-10-CM | POA: Diagnosis not present

## 2020-07-13 DIAGNOSIS — F840 Autistic disorder: Secondary | ICD-10-CM | POA: Diagnosis not present

## 2020-07-13 MED ORDER — LAMOTRIGINE 100 MG PO TABS: ORAL_TABLET | ORAL | 5 refills | Status: DC

## 2020-07-13 NOTE — Patient Instructions (Signed)
It was good to see you today. I am glad that your seizures are controlled as best we know. I need to know which pharmacy I should send the prescription to. Once I do I will send it. I would like to see you again in 6 months time. At that time I hope to have a better idea of who the adult neurologist will be to give you long-term care once I retire in September 2022.

## 2020-07-13 NOTE — Progress Notes (Signed)
Patient: Douglas Levine MRN: [id number] Sex: male DOB: [birth year]  Provider: Wyline Copas, MD Location of Care: [facility]  Note type: Routine return visit  History of Present Illness: Referral Source: [facility] History from: patient and [facility] chart Chief Complaint: Epilepsy/Autism  Douglas Levine is a 30 y.o. male who was evaluated July 13, 2020 for the first time since August 04, 2019.  Douglas Levine has high functioning autism with borderline intellectual ability and preserved language.  He has juvenile absence epilepsy.  When compliant with his medication he is seizure-free.  His last seizure was in April 2021.  His father has been very concerned that there may be nocturnal seizures.  We attempted to get this evaluated by placing a computer with a infrared lens in his room, but he rejected it.  This is currently under litigation.  I talked to him again and told him that our main concern is to make certain that his seizures are under complete control because failure to completely control his seizures leaves him at risk for sudden unexplained death in an epileptic patient.  He is living in an AFL and was here today with his caregiver.  His general health is good.  He is down about 5 pounds since his last visit a year ago.  By history he goes to bed between 10 PM and 10:30 PM falls asleep in the half hour and sleeps until 7 AM.  His caregiver gets him into the community.  He enjoys watching movies.  He has continuing education at [employer].  As best I know he has not contracted Covid.  I do not know if he has been vaccinated.  Review of Systems: A complete review of systems was remarkable for patient is here to be seen for epilepsy and autism. He states that he has ben doing okay since his last visit. He reports that he had a seizure inApril 2021. He states that he has not had any seizures since that one. He has no concerns at  this time., all other systems reviewed and negative.  Past Medical History Diagnosis Date  . Alcohol abuse   . Autism   . Borderline intellectual disability   . Drug abuse ([place])   . Other specified pervasive developmental disorders, current or active state   . Schizoaffective-type schizophrenia ([place])   . Seizure ([place])    Hospitalizations: No., Head Injury: No., Nervous System Infections: No., Immunizations up to date: Yes.    Copied from prior chart notes He had MRI and EEG in [place] before the family moved to this area.  On evaluation December 10, 1995 he was noted to have a behavior disorder, and developmental delay. He had a nonfocal neurological examination. EEG performed On May 17, 2006 showed episodes of 6-10 seconds of 2-1/2 Hz 400 V spike and slow-wave activity was regularly contoured. He was poorly responsive during this time. He also had frontally predominant Isolated spike and slow-wave discharges. Background was otherwise normal.  He was initially placed on Zonegran on May 23, 2006.  ER visit around October of 2015 due to seizure activity.  Douglas Levine' emerging health problem has been psoriasis on his arms. He has been to see a dermatologist, who has tried a variety of topical agents. It may be necessary to use immune therapy, which carries its own risks.  ER visits due to seizure activity.  Birth History Adopted in 1993 6 lbs. 6 oz. Infant born following an uncomplicated pregnancy except  mother received no prenatal care. She will is a 29 year old primigravida. Gestation was complicated by nausea and the first trimester. The patient had limited use of alcohol no use of cigarettes or drugs during pregnancy.  In the nursery he had a poor suck, and jaundice treated with the phototherapy. He remained for 5 days. The patient wanted 13-15 months, talk to 3 years and became toilet trained by about 4.  Behavior History Autism spectrum disorder, level  1  Surgical History Procedure Laterality Date  . CIRCUMCISION  1993   Family History family history includes Alcohol abuse in his father; Drug abuse in his father; Other in his mother. Family history is negative for migraines, seizures, intellectual disabilities, blindness, deafness, birth defects, chromosomal disorder, or autism.  Social History Socioeconomic History  . Marital status: Single  . Years of education:  5  . Highest education level:  High school graduate  Occupational History  . Not employed  Tobacco Use  . Smoking status: Current Some Day Smoker    Packs/day: 0.50  . Smokeless tobacco: Never Used  Substance and Sexual Activity  . Alcohol use: Yes    Alcohol/week: 0.0 standard drinks    Comment: A few drinks a week  . Drug use: Yes    Types: Cocaine  . Sexual activity: Yes    Partners: Female    Birth control/protection: Condom  Social History Narrative    Douglas Levine is a 30 yo male.    He attends [name].    He is Glass blower/designer in Geologist, engineering.    He lives with his AFL caregiver. He has two siblings.   No Known Allergies  Physical Exam BP 124/78   Pulse 80   Ht 6\' 1"  (1.854 m)   Wt 171 lb 3.2 oz (77.7 kg)   BMI 22.59 kg/m   General: alert, well developed, well nourished, in no acute distress, blond hair, blue eyes, left handed Head: normocephalic, no dysmorphic features Ears, Nose and Throat: Otoscopic: tympanic membranes normal; pharynx: oropharynx is pink without exudates or tonsillar hypertrophy Neck: supple, full range of motion, no cranial or cervical bruits Respiratory: auscultation clear Cardiovascular: no murmurs, pulses are normal Musculoskeletal: no skeletal deformities or apparent scoliosis Skin: no rashes or neurocutaneous lesions  Neurologic Exam  Mental Status: alert; oriented to person, place and year; knowledge is normal for age; language is normal; flat affect, intermittent eye contact Cranial Nerves: visual  fields are full to double simultaneous stimuli; extraocular movements are full and conjugate; pupils are round reactive to light; funduscopic examination shows sharp disc margins with normal vessels; symmetric facial strength; midline tongue and uvula; air conduction is greater than bone conduction bilaterally Motor: Normal strength, tone and mass; good fine motor movements; no pronator drift Sensory: intact responses to cold, vibration, proprioception and stereognosis Coordination: good finger-to-nose, rapid repetitive alternating movements and finger apposition Gait and Station: normal gait and station: patient is able to walk on heels, toes and tandem without difficulty; balance is adequate; Romberg exam is negative; Gower response is negative Reflexes: symmetric and diminished bilaterally; no clonus; bilateral flexor plantar responses  Assessment 1.  Generalized convulsive epilepsy, G40.309. 2.  Autism spectrum disorder with accompanying intellectual i impairment, requiring support (level one), F84.0.  Discussion I am pleased that his seizures appear to be under good control.  I do not know why there is a concern that he has seizures at nighttime but his caregiver sleeps nowhere near his room and would be unaware if he had  a seizure.  Plan There is no reason to change his current medication.  I refilled prescription for lamotrigine.  He will return to see me in 6 months.  I hope at that time to be able to give him some guidance as to who will provide long-term care for him.  Greater than 50% of a 30-minute visit was spent in counseling and coordination of care concerning his seizures and autism, concerns raised about nocturnal seizures and the consequences of them which is the reason we want to do further evaluation.  Is not surprising to me that he is unwilling to be evaluated while he sleeps I think in part he has autism and lack of insight plays a role in this rigidity.   Medication List    Accurate as of July 13, 2020 11:14 AM. If you have any questions, ask your nurse or doctor.    Humira Pen 40 MG/0.8ML Pnkt Generic drug: Adalimumab INJECT 1 PEN Q 2 WEEKS   lamoTRIgine 100 MG tablet Commonly known as: LAMICTAL Take 3-1/2 tablets twice daily    The medication list was reviewed and reconciled. All changes or newly prescribed medications were explained.  A complete medication list was provided to the patient/caregiver.  Deetta Perla MD

## 2020-07-13 NOTE — Telephone Encounter (Signed)
error 

## 2020-11-13 ENCOUNTER — Encounter (INDEPENDENT_AMBULATORY_CARE_PROVIDER_SITE_OTHER): Payer: Self-pay

## 2020-12-23 ENCOUNTER — Telehealth (INDEPENDENT_AMBULATORY_CARE_PROVIDER_SITE_OTHER): Payer: Self-pay | Admitting: Pediatrics

## 2020-12-23 ENCOUNTER — Encounter: Payer: Self-pay | Admitting: Neurology

## 2020-12-23 DIAGNOSIS — F840 Autistic disorder: Secondary | ICD-10-CM

## 2020-12-23 DIAGNOSIS — G40309 Generalized idiopathic epilepsy and epileptic syndromes, not intractable, without status epilepticus: Secondary | ICD-10-CM

## 2020-12-23 NOTE — Telephone Encounter (Signed)
Request transfer care to Dr. Patrcia Dolly for long-term management of epilepsy.

## 2021-01-11 ENCOUNTER — Ambulatory Visit (INDEPENDENT_AMBULATORY_CARE_PROVIDER_SITE_OTHER): Payer: Medicare Other | Admitting: Pediatrics

## 2021-02-15 ENCOUNTER — Encounter: Payer: Self-pay | Admitting: Neurology

## 2021-02-15 ENCOUNTER — Ambulatory Visit (INDEPENDENT_AMBULATORY_CARE_PROVIDER_SITE_OTHER): Payer: Medicare Other | Admitting: Neurology

## 2021-02-15 ENCOUNTER — Other Ambulatory Visit: Payer: Self-pay

## 2021-02-15 VITALS — BP 110/73 | HR 73 | Resp 18 | Ht 73.5 in | Wt 164.0 lb

## 2021-02-15 DIAGNOSIS — G40309 Generalized idiopathic epilepsy and epileptic syndromes, not intractable, without status epilepticus: Secondary | ICD-10-CM | POA: Diagnosis not present

## 2021-02-15 MED ORDER — LAMOTRIGINE 100 MG PO TABS: ORAL_TABLET | ORAL | 3 refills | Status: DC

## 2021-02-15 NOTE — Patient Instructions (Addendum)
Good to meet you. Continue Lamotrigine 100mg : Take 3 and 1/2 tablets twice a day. Follow-up in 1 year, call for any changes   Seizure Precautions: 1. If medication has been prescribed for you to prevent seizures, take it exactly as directed.  Do not stop taking the medicine without talking to your doctor first, even if you have not had a seizure in a long time.   2. Avoid activities in which a seizure would cause danger to yourself or to others.  Don't operate dangerous machinery, swim alone, or climb in high or dangerous places, such as on ladders, roofs, or girders.  Do not drive unless your doctor says you may.  3. If you have any warning that you may have a seizure, lay down in a safe place where you can't hurt yourself.    4.  No driving for 6 months from last seizure, as per [facility].   Please refer to the following link on the Epile[employer] of America's website for more information: http://www.epilepsyfoundation.org/answerplace/Social/driving/drivingu.cfm   5.  Maintain good sleep hygiene. Avoid alcohol.  6.  Contact your doctor if you have any problems that may be related to the medicine you are taking.  7.  Call 911 and bring the patient back to the ED if:        A.  The seizure lasts longer than 5 minutes.       B.  The patient doesn't awaken shortly after the seizure  C.  The patient has new problems such as difficulty seeing, speaking or moving  D.  The patient was injured during the seizure  E.  The patient has a temperature over 102 F (39C)  F.  The patient vomited and now is having trouble breathing

## 2021-02-15 NOTE — Progress Notes (Signed)
NEUROLOGY CONSULTATION NOTE  Douglas Levine MRN: [id number] DOB: 1990-08-04  Referring provider: Dr. Ellison Levine Primary care provider: Dr. Virl Levine  Reason for consult:  establish adult epilepsy care  Dear Dr Douglas Levine:  Thank you for your kind referral of Douglas Levine for consultation of the above symptoms. Although his history is well known to you, please allow me to reiterate it for the purpose of our medical record. The patient was accompanied to the clinic by his AFL provider Douglas Levine who also provides collateral information. Records and images were personally reviewed where available.   HISTORY OF PRESENT ILLNESS: This is a 30 year old left-handed man with a history of high-functioning autism with borderline intellectual ability, juvenile absence epilepsy, presenting to establish adult epilepsy care. He is accompanied by Douglas Levine, his AFL provider since February 2022, who provides additional information. Records from his pediatric neurologist Dr. Sharene Levine were reviewed and will be summarizes as follows. He was adopted in [birth year], 1991. He was diagnosed with developmental delay and behavior disorder in 1997. He had an EEG in 2007 with episodes of 6-10 seconds of 2-1/2 Hz spike and slow wave activity, he was poorly responsive during this time. He also had frontally predominant isolated spike and slow wave discharges. Per notes, he had an MRI and EEG in [place] before moving to . He was initially on Zonisamide in 2007. He has been on Lamotrigine for many years. He states that the last seizure was in April 2021, longest seizure-free interval is almost 2 years. He very rarely has a warning prior to his seizures, sometimes he feels dizzy. He states most of the time, seizures occur when he is not taking his medications. He manages his own medications and Douglas Levine checks behind, reporting he is very compliant. He has very rare body jerks. He and Douglas Levine deny any staring/unresponsive episodes, gaps  in time, olfactory/gustatory hallucinations, deja vu, rising epigastric sensation, focal numbness/tingling/weakness. Concern was raised in the past by his father about nocturnal seizures, he denies waking up with tongue bite or incontinence. Douglas Levine has not noticed any episodes concerning for nocturnal seizures although sleeps in a different room. No headaches, dizziness, vision changes, dysarthria/dysphagia, neck/back pain, bowel/bladder dysfunction. Long-term memory is good, short-term is not so good. He does not drive. He hopes to enter the job force, he is done with school. Mood is good. He lives with Douglas Levine and her family (alternative family living), his father manages finances and is his legal guardian.   Epilepsy Risk Factors:  He had poor such and jaundice in the nursery, developmental delay. He was adopted. There is no listed history of febrile convulsions, CNS infections such as meningitis/encephalitis, significant traumatic brain injury, neurosurgical procedures, or family history of seizures.  Prior AEDs: Zonisamide    PAST MEDICAL HISTORY: Past Medical History:  Diagnosis Date   Alcohol abuse    Autism    Borderline intellectual disability    Drug abuse (HCC)    Other specified pervasive developmental disorders, current or active state    Schizoaffective-type schizophrenia (HCC)    Seizure (HCC)     PAST SURGICAL HISTORY: Past Surgical History:  Procedure Laterality Date   CIRCUMCISION  1992-06-11    MEDICATIONS: Current Outpatient Medications on File Prior to Visit  Medication Sig Dispense Refill   HUMIRA PEN 40 MG/0.8ML PNKT INJECT 1 PEN Q 2 WEEKS  5   lamoTRIgine (LAMICTAL) 100 MG tablet Take 3-1/2 tablets twice daily 217 tablet 5   No current facility-administered medications on  file prior to visit.    ALLERGIES: No Known Allergies  FAMILY HISTORY: Family History  Problem Relation Age of Onset   Other Mother        Mother was a slow learner was in special  education until 6 th grade.   Alcohol abuse Father        atient's father had a history of alcohol and drug abuse and behavior problems.    Drug abuse Father     SOCIAL HISTORY: Social History   Socioeconomic History   Marital status: Single    Spouse name: Not on file   Number of children: Not on file   Years of education: Not on file   Highest education level: Not on file  Occupational History   Not on file  Tobacco Use   Smoking status: Some Days    Packs/day: 0.50    Types: Cigarettes   Smokeless tobacco: Never  Substance and Sexual Activity   Alcohol use: Yes    Alcohol/week: 0.0 standard drinks    Comment: A few drinks a week   Drug use: Yes    Types: Cocaine   Sexual activity: Yes    Partners: Female    Birth control/protection: Condom  Other Topics Concern   Not on file  Social History Narrative   Douglas Levine is a 30 yo male.   He attends [employer].   He is Glass blower/designer in [employer], [employer].   He lives with his AF mom. He has two siblings.   Left handed   Drinks caffeine   Two story home   Social Determinants of Health   Financial Resource Strain: Not on file  Food Insecurity: Not on file  Transportation Needs: Not on file  Physical Activity: Not on file  Stress: Not on file  Social Connections: Not on file  Intimate Partner Violence: Not on file     PHYSICAL EXAM: Vitals:   02/15/21 1354  BP: 110/73  Pulse: 73  Resp: 18  SpO2: 98%   General: No acute distress Head:  Normocephalic/atraumatic Skin/Extremities: No rash, no edema Neurological Exam: Mental status: alert and oriented to person, place, and time, no dysarthria or aphasia, Fund of knowledge is appropriate.  Recent and remote memory are intact, 3/3 delayed recall.  Attention and concentration are normal, 5/5 WORLD backward. Cranial nerves: CN I: not tested CN II: pupils equal, round and reactive to light, visual fields intact CN III, IV, VI:  full range of motion, no nystagmus,  no ptosis CN V: facial sensation intact CN VII: upper and lower face symmetric CN VIII: hearing intact to conversation Bulk & Tone: normal, no fasciculations. Motor: 5/5 throughout with no pronator drift. Sensation: intact to light touch, cold, pin, vibration and joint position sense.  No extinction to double simultaneous stimulation.  Romberg test negative Deep Tendon Reflexes: +2 throughout Cerebellar: no incoordination on finger to nose testing Gait: narrow-based and steady, able to tandem walk adequately. Tremor: none   IMPRESSION: This is a 30 year old left-handed man with a history of high-functioning autism with borderline intellectual ability, juvenile absence epilepsy, presenting to establish adult epilepsy care. He denies any seizures since April 2021 on Lamotrigine 350mg  twice a day, no side effects. We discussed avoidance of seizure triggers. He does not drive. Continue close supervision. Follow-up in 1 year, they know to call for any changes.    Thank you for allowing me to participate in the care of this patient. Please do not hesitate to call  for any questions or concerns.   Patrcia Dolly, M.D.  CC: Dr. Sharene Levine, Dr. Leavy Cella

## 2021-03-08 ENCOUNTER — Other Ambulatory Visit (INDEPENDENT_AMBULATORY_CARE_PROVIDER_SITE_OTHER): Payer: Self-pay

## 2021-03-08 DIAGNOSIS — G40309 Generalized idiopathic epilepsy and epileptic syndromes, not intractable, without status epilepticus: Secondary | ICD-10-CM

## 2021-05-05 ENCOUNTER — Telehealth: Payer: Self-pay | Admitting: Neurology

## 2021-05-05 NOTE — Telephone Encounter (Signed)
Patient's caregiver [place] called and said the patient had a seizure and hit the floor pretty hard.   She took him to the ED to be sure he is okay.   They are just leaving now to go home and wanted Dr. Karel Jarvis to be aware.  She said it's only necessary to call back if there is new information.

## 2021-05-18 ENCOUNTER — Encounter: Payer: Self-pay | Admitting: Neurology

## 2021-05-26 NOTE — Telephone Encounter (Signed)
Noted, thanks!

## 2021-07-05 ENCOUNTER — Telehealth: Payer: Self-pay | Admitting: Neurology

## 2021-07-05 ENCOUNTER — Other Ambulatory Visit: Payer: Self-pay

## 2021-07-05 DIAGNOSIS — Z5181 Encounter for therapeutic drug level monitoring: Secondary | ICD-10-CM

## 2021-07-05 NOTE — Telephone Encounter (Signed)
Patient's caregiver [facility] called and said the patient had another seizure this morning.   He has called out of work due to fatigue post seizure.  Ms. [name] wants to know if perhaps his medications need adjusted or if he may need a sooner return appointment?  [facility] on [address]

## 2021-07-05 NOTE — Telephone Encounter (Signed)
Called care giver gave her address to [facility] on [address] [address] and  she is taking patient tomorrow to get his blood drawn

## 2021-07-05 NOTE — Telephone Encounter (Signed)
Pt c/o: seizure Missed medications?  No. Sleep deprived?  No. Alcohol intake?  No Back to their usual baseline self?  Yes.  Feeling very Lethargic . If no, advise go to ER Current medications prescribed by Dr. Karel Jarvis: Lamictal Patient has had two seizures  one on 05/05/21 and then one today

## 2021-07-05 NOTE — Telephone Encounter (Signed)
He is on a pretty good dose already of Lamictal, pls confirm he is taking 3 and 1/2 tablets in AM and 3 and 1/2 tabs in PM. Would check a Lamictal level first thing in the morning before he takes his morning dose. Is there a lab close by where she can bring him for bloodwork? Thanks

## 2021-07-05 NOTE — Telephone Encounter (Signed)
Calling care giver back in a few min to give her address to lab to get blood work drawn today [address] in  [place]

## 2021-07-06 ENCOUNTER — Other Ambulatory Visit: Payer: Medicare Other

## 2021-07-06 DIAGNOSIS — Z5181 Encounter for therapeutic drug level monitoring: Secondary | ICD-10-CM

## 2021-07-11 ENCOUNTER — Telehealth: Payer: Self-pay

## 2021-07-11 LAB — LAMOTRIGINE LEVEL: Lamotrigine Lvl: 6.3 ug/mL (ref 4.0–18.0)

## 2021-07-11 MED ORDER — LAMOTRIGINE 200 MG PO TABS: ORAL_TABLET | ORAL | 3 refills | Status: DC

## 2021-07-11 NOTE — Telephone Encounter (Signed)
-----   Message from Van Clines, MD sent at 07/11/2021  9:55 AM EST ----- Pls let his caregiver know there is room to increase the Lamictal dose, currently he is on 350mg  twice a day, we will increase to 400mg  twice a day. That is a pretty high dose, let know if he feels dizzy on the higher dose. His new prescription will be for Lamictal 200mg : Take 2 tablets twice a day, I sent it in. Thanks

## 2021-07-11 NOTE — Telephone Encounter (Signed)
Spoke with caregiver (his father Molly Maduro) know there is room to increase the Lamictal dose, currently he is on 350mg  twice a day, we will increase to 400mg  twice a day. That is a pretty high dose, let know if he feels dizzy on the higher dose. His new prescription will be for Lamictal 200mg : Take 2 tablets twice a day.

## 2021-12-05 ENCOUNTER — Telehealth: Payer: Self-pay | Admitting: Neurology

## 2021-12-05 DIAGNOSIS — Z5181 Encounter for therapeutic drug level monitoring: Secondary | ICD-10-CM

## 2021-12-05 NOTE — Telephone Encounter (Signed)
Pt c/o: seizure Missed medications?  No. Sleep deprived?  No. Alcohol intake?  Yes.   Had some beers  Increased stress? No. Any change in medication color or shape? No. Any triggers? In the morning when making his coffee  Back to their usual baseline self?  Yes.  . If no, advise go to ER Current medications prescribed by Dr. Karel Jarvis: lamotrigine 200 mg  Take 2 tablets (400mg ) twice a day

## 2021-12-05 NOTE — Telephone Encounter (Signed)
[name] called and stated that Douglas Levine had a seizure over the weekend.  She didn't know if anything needed to be done or switched with his medication.

## 2021-12-06 NOTE — Telephone Encounter (Signed)
Pls confirm if he is taking his own medication or caregiver is giving to him? Would check Lamictal level first thing in the morning before he takes his morning dose. Also, no more alcohol, this can trigger seizures. Thanks

## 2021-12-07 NOTE — Telephone Encounter (Signed)
Called mrs Crews back no answer unable to leave a voice mail

## 2021-12-08 NOTE — Telephone Encounter (Signed)
They are asking if playing video games and having ear phones on all the time can cause the seizures. They will bring him in Monday for lab work order placed in epic, for his medication he has a mon-Friday box and care giver checks to make sure he takes it

## 2021-12-08 NOTE — Telephone Encounter (Signed)
Called mrs Crews no answer no voice mail set up

## 2021-12-08 NOTE — Telephone Encounter (Signed)
If he is not sleeping because he is on these devices, then that can trigger seizures, but the earphones and video games by themselves not necessarily.

## 2021-12-11 NOTE — Telephone Encounter (Signed)
Spoke with Mrs Douglas Levine informed her that Dr Karel Jarvis stated If he is not sleeping because he is on these devices, then that can trigger seizures, but the earphones and video games by themselves not necessarily.

## 2021-12-12 ENCOUNTER — Other Ambulatory Visit (INDEPENDENT_AMBULATORY_CARE_PROVIDER_SITE_OTHER): Payer: Medicare Other

## 2021-12-12 DIAGNOSIS — Z5181 Encounter for therapeutic drug level monitoring: Secondary | ICD-10-CM | POA: Diagnosis not present

## 2021-12-14 LAB — LAMOTRIGINE LEVEL: Lamotrigine Lvl: 14.5 ug/mL (ref 2.5–15.0)

## 2021-12-15 ENCOUNTER — Telehealth: Payer: Self-pay

## 2021-12-15 NOTE — Telephone Encounter (Signed)
I got patient schedule for 12-26-21 with Delice Lesch as Nira Conn had advised that they could come in on 12-26-21 @ 11:30

## 2021-12-15 NOTE — Telephone Encounter (Signed)
Spoke with pt care giver and father informed them that his Lamictal level is pretty good. Dr Karel Jarvis would have them come in to the office for an earlier visit, he may need to add a second seizure medication on.   They can come in on June 20 at 11:30am

## 2021-12-15 NOTE — Telephone Encounter (Signed)
-----   Message from Cameron Sprang, MD sent at 12/15/2021  8:26 AM EDT ----- Pls let his caregiver know that his Lamictal level is pretty good. I would have them come in to the office for an earlier visit, he may need to add a second seizure medication on. Pls see if they can come on June 20 at 11:30am? Thanks

## 2021-12-26 ENCOUNTER — Encounter: Payer: Self-pay | Admitting: Neurology

## 2021-12-26 ENCOUNTER — Ambulatory Visit (INDEPENDENT_AMBULATORY_CARE_PROVIDER_SITE_OTHER): Payer: Medicare Other | Admitting: Neurology

## 2021-12-26 VITALS — BP 115/68 | HR 64 | Ht 74.0 in | Wt 165.0 lb

## 2021-12-26 DIAGNOSIS — G40309 Generalized idiopathic epilepsy and epileptic syndromes, not intractable, without status epilepticus: Secondary | ICD-10-CM | POA: Diagnosis not present

## 2021-12-26 MED ORDER — BRIVIACT 50 MG PO TABS
ORAL_TABLET | ORAL | 5 refills | Status: DC
Start: 2021-12-26 — End: 2022-01-12

## 2021-12-26 NOTE — Progress Notes (Signed)
NEUROLOGY FOLLOW UP OFFICE NOTE  Douglas Levine [id number] 09-28-1990  HISTORY OF PRESENT ILLNESS: I had the pleasure of seeing Douglas Levine in follow-up in the neurology clinic on 12/26/2021.  The patient was last seen 10 months ago for epilepsy. He presents for an urgent visit due to an increase in seizures. He is accompanied by [facility], his ALF provider (Alberta's sister). I spoke to his father Douglas Maduro separately on the phone after the visit. Since his last visit, they had called our office to report a seizure in 04/2021 where he hit his head on the floor. He had another seizure in 06/2021. Lamictal level on 350mg  BID was 6.3, he was instructed to increase Lamotrigine dose to 400mg  BID. He had another seizure at the end of May. Lamotrigine level was 14.5. He had some beers that time. He and Douglas Levine deny missing any medications. He usually gets 8 hours of sleep. His father reports that most of his seizures have occurred when he wakes up in the morning. He has no prior warning symptoms, he is exhausted after and goes to bed. Douglas Levine denies any staring/unresponsive episodes. He denies any headaches, dizziness, focal numbness/tingling/weakness, no other falls. He had a transient elevation in LFTs in April, ALT 139, AST 49. Repeat LFTs in 11/2021 showed improvement with normal AST, 59 ALT. He is excited about his upcoming trip to [facility] with his father and sister. Douglas Levine denies any behavioral issues. He states mood is good.   History on Initial Assessment 02/15/2021: This is a 31 year old left-handed man with a history of high-functioning autism with borderline intellectual ability, juvenile absence epilepsy, presenting to establish adult epilepsy care. He is accompanied by 04/17/2021, his AFL provider since February 2022, who provides additional information. Records from his pediatric neurologist [facility] were reviewed and will be summarizes as follows. He was adopted in 12-Jan-1991. He was diagnosed with developmental  delay and behavior disorder in 1997. He had an EEG in 2007 with episodes of 6-10 seconds of 2-1/2 Hz spike and slow wave activity, he was poorly responsive during this time. He also had frontally predominant isolated spike and slow wave discharges. Per notes, he had an MRI and EEG in 1998 before moving to [name]. He was initially on Zonisamide in 2007. He has been on Lamotrigine for many years. He states that the last seizure was in April 2021, longest seizure-free interval is almost 2 years. He very rarely has a warning prior to his seizures, sometimes he feels dizzy. He states most of the time, seizures occur when he is not taking his medications. He manages his own medications and 2008 checks behind, reporting he is very compliant. He has very rare body jerks. He and 08-03-1968 deny any staring/unresponsive episodes, gaps in time, olfactory/gustatory hallucinations, deja vu, rising epigastric sensation, focal numbness/tingling/weakness. Concern was raised in the past by his father about nocturnal seizures, he denies waking up with tongue bite or incontinence. Levine has not noticed any episodes concerning for nocturnal seizures although sleeps in a different room. No headaches, dizziness, vision changes, dysarthria/dysphagia, neck/back pain, bowel/bladder dysfunction. Long-term memory is good, short-term is not so good. He does not drive. He hopes to enter the job force, he is done with school. Mood is good. He lives with Levine and her family (alternative family living), his father manages finances and is his legal guardian.   Epilepsy Risk Factors:  He had poor such and jaundice in the nursery, developmental delay. He was adopted. There is  no listed history of febrile convulsions, CNS infections such as meningitis/encephalitis, significant traumatic brain injury, neurosurgical procedures, or family history of seizures.  Prior AEDs: Zonisamide    PAST MEDICAL HISTORY: Past Medical History:  Diagnosis Date    Alcohol abuse    Autism    Borderline intellectual disability    Drug abuse (HCC)    Other specified pervasive developmental disorders, current or active state    Schizoaffective-type schizophrenia (HCC)    Seizure (HCC)     MEDICATIONS: Current Outpatient Medications on File Prior to Visit  Medication Sig Dispense Refill   HUMIRA PEN 40 MG/0.8ML PNKT INJECT 1 PEN Q 2 WEEKS  5   lamoTRIgine (LAMICTAL) 200 MG tablet Take 2 tablets (400mg ) twice a day 360 tablet 3   No current facility-administered medications on file prior to visit.    ALLERGIES: No Known Allergies  FAMILY HISTORY: Family History  Problem Relation Age of Onset   Other Mother        Mother was a slow learner was in special education until 6 th grade.   Alcohol abuse Father        atient's father had a history of alcohol and drug abuse and behavior problems.    Drug abuse Father     SOCIAL HISTORY: Social History   Socioeconomic History   Marital status: Single    Spouse name: Not on file   Number of children: Not on file   Years of education: Not on file   Highest education level: Not on file  Occupational History   Not on file  Tobacco Use   Smoking status: Some Days    Packs/day: 0.50    Types: Cigarettes   Smokeless tobacco: Never  Substance and Sexual Activity   Alcohol use: Yes    Alcohol/week: 0.0 standard drinks of alcohol    Comment: A few drinks a week   Drug use: Yes    Types: Cocaine   Sexual activity: Yes    Partners: Female    Birth control/protection: Condom  Other Topics Concern   Not on file  Social History Narrative   Douglas Levine is a 31 yo male.   He attends [employer].   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   He lives with his AF mom. He has two siblings.   Left handed   Drinks caffeine   Two story home   Social Determinants of Health   Financial Resource Strain: Not on file  Food Insecurity: Not on file  Transportation Needs: Not on file  Physical  Activity: Not on file  Stress: Not on file  Social Connections: Not on file  Intimate Partner Violence: Not on file     PHYSICAL EXAM: Vitals:   12/26/21 1131  BP: 115/68  Pulse: 64  SpO2: 100%   General: No acute distress Head:  Normocephalic/atraumatic Skin/Extremities: No rash, no edema Neurological Exam: alert and awake. No aphasia or dysarthria. Fund of knowledge is appropriate.  Attention and concentration are normal.   Cranial nerves: Pupils equal, round. Extraocular movements intact with no nystagmus. Visual fields full.  No facial asymmetry.  Motor: Bulk and tone normal, muscle strength 5/5 throughout with no pronator drift.   Finger to nose testing intact.  Gait narrow-based and steady, no ataxia.   IMPRESSION: This is a 31 yo LH man with a history of high-functioning autism with borderline intellectual ability, juvenile absence epilepsy, with an increase in seizures this past year. He had previously been seizure-free  for over a year, then has had 4 seizures in the past 8 months. He is on Lamotrigine 400mg  BID, we discussed adding on Briviact 50mg  BID. Side effects discussed. He does not drive. We discussed avoidance of seizure triggers, including missing medication, alcohol, and sleep deprivation. He and his father asked about CBD, we discussed indications for Epidiolex for epilepsy, at this time he is not a candidate for Epidiolex. Follow-up as scheduled in August 2023, call for any changes.    Thank you for allowing me to participate in his care.  Please do not hesitate to call for any questions or concerns.    , M.D.   CC: Dr. 10-26-1992

## 2021-12-26 NOTE — Patient Instructions (Signed)
Good to see you.  Start the Briviact (Brivaracetam) 50mg  tablet: take 1 tablet every night for 1 week, then increase to 1 tablet twice a day and continue  2. Continue Lamotrigine 100mg : Take 2 tablets twice a day  3. Follow-up as scheduled in August, call for any changes   Seizure Precautions: 1. If medication has been prescribed for you to prevent seizures, take it exactly as directed.  Do not stop taking the medicine without talking to your doctor first, even if you have not had a seizure in a long time.   2. Avoid activities in which a seizure would cause danger to yourself or to others.  Don't operate dangerous machinery, swim alone, or climb in high or dangerous places, such as on ladders, roofs, or girders.  Do not drive unless your doctor says you may.  3. If you have any warning that you may have a seizure, lay down in a safe place where you can't hurt yourself.    4.  No driving for 6 months from last seizure, as per [facility].   Please refer to the following link on the Epilepsy Foundation of America's website for more information: http://www.epilepsyfoundation.org/answerplace/Social/driving/drivingu.cfm   5.  Maintain good sleep hygiene. Avoid alcohol.  6.  Contact your doctor if you have any problems that may be related to the medicine you are taking.  7.  Call 911 and bring the patient back to the ED if:        A.  The seizure lasts longer than 5 minutes.       B.  The patient doesn't awaken shortly after the seizure  C.  The patient has new problems such as difficulty seeing, speaking or moving  D.  The patient was injured during the seizure  E.  The patient has a temperature over 102 F (39C)  F.  The patient vomited and now is having trouble breathing

## 2022-01-12 ENCOUNTER — Telehealth: Payer: Self-pay | Admitting: Neurology

## 2022-01-12 MED ORDER — BRIVARACETAM 100 MG PO TABS
100.0000 mg | ORAL_TABLET | Freq: Two times a day (BID) | ORAL | 4 refills | Status: DC
Start: 2022-01-12 — End: 2022-02-13

## 2022-01-12 NOTE — Telephone Encounter (Addendum)
Spoke with pt care giver Mrs Ivin Booty informed her that Dr Karel Jarvis stated Would not recommend a helmet, it is common for seizures to occur early in the morning. He needs to stop alcohol intake at this point. Is he tolerating the Briviact we started? Pt care giver stated that he is doing well no side effects , we can increase dose to 100mg  BID. Sending to Dr to send in prescription

## 2022-01-12 NOTE — Telephone Encounter (Signed)
Pt had another seizure this morning in the shower. He hit his head and went to the ED and they said everything was fine. His last 5-6 seizures have been as soon as he wakes up in the morning or around his shower time. They are wondering if maybe he should wear a helmet in the morning?   He had admitted drinking some beer on Wednesday. They did a breathalyzer and it was 0.7

## 2022-01-12 NOTE — Addendum Note (Signed)
Addended by: Van Clines on: 01/12/2022 03:36 PM   Modules accepted: Orders

## 2022-01-12 NOTE — Telephone Encounter (Signed)
Would not recommend a helmet, it is common for seizures to occur early in the morning. He needs to stop alcohol intake at this point. Is he tolerating the Briviact we started? If no side effects, we can increase dose to 100mg  BID.

## 2022-01-12 NOTE — Addendum Note (Signed)
Addended by: Dimas Chyle on: 01/12/2022 03:24 PM   Modules accepted: Orders

## 2022-01-25 ENCOUNTER — Telehealth: Payer: Self-pay | Admitting: Neurology

## 2022-01-25 ENCOUNTER — Telehealth: Payer: Self-pay

## 2022-01-25 NOTE — Telephone Encounter (Signed)
Ok for [employer] 100mg  BID samples. If father is interested in the [place], it is the only FDA-cleared wrist-worn wearable in epilepsy. It detects possible convulsive seizures and instantly alerts caregivers. Thanks

## 2022-01-25 NOTE — Telephone Encounter (Signed)
Got a call from call center, Caregiver Cindy at 5 pm today. Patient needed to know if Briviact was to be increased to 1.5 tab (150 mg) given that he had a seizure yesterday.  I went over today's notes regarding this medication, and that the dose already has been increased today to 100 mg bid (from 50 mg bid) and that samples were provided to the patient as he is going out of town, and that patient was instructed regarding the correct time, dose and frequency and side effects. Patient acknowledged these instructions at the time Patient is asymptomatic for seizures at this moment Instructed Arline Asp that if there are further questions regarding the dosage or other details, to call our office tomorrow during business hours.  If needed, recommend ER visit.  Caregiver Arline Asp was appreciative of the call, all questions answered

## 2022-01-25 NOTE — Telephone Encounter (Signed)
Pt c/o: seizure Missed medications?  No. Sleep deprived?  No. Alcohol intake?  Yes.   Increased stress? Yes.   Any change in medication color or shape? Yes.   Back to their usual baseline self?  Yes.  . If no, advise go to ER Current medications prescribed by Dr. Karel Jarvis: Brivaracetam 100 mg 2 x daily Any triggers? None , he was just sitting at the table and screamed out and fell out of chair and hit his head and had to have stitches. His seizure are in the AM and only one in the shower around 6 pm.

## 2022-01-25 NOTE — Telephone Encounter (Signed)
Call [name] and give her the information Per Dr. Karel Jarvis. She is sending the information to [facility] Dad.

## 2022-01-25 NOTE — Telephone Encounter (Signed)
Medication Samples have been provided to the patient.  Drug name: briviact       Strength: 100 mg        Qty: 2 boxs  LOT: [id number]  Exp.Date: 09/2023  Dosing instructions: 1 tab 2 times daily  The patient has been instructed regarding the correct time, dose, and frequency of taking this medication, including desired effects and most common side effects.   Tamber Burtch G Otha Rickles 11:24 AM 01/25/2022  

## 2022-01-25 NOTE — Telephone Encounter (Signed)
Pt's caregiver called in stating the pt had a seizure yesterday that required an ED visit. She also stated she cannot get any Briviact from the pharmacy. It isn't in stock until Friday, but he is going out of town before then. She is wondering if we may have any samples?   The pt's dad Molly Maduro has some reservations about the patient working part time and potentially installing a camera in his bedroom to monitor him. He would like to speak with someone about that. His phone number is [phone]

## 2022-01-26 ENCOUNTER — Telehealth: Payer: Self-pay | Admitting: Neurology

## 2022-01-26 NOTE — Telephone Encounter (Signed)
Patients caregiver called and stated Nicks father would like a call back regarding his son. He has a few questions for the Dr.

## 2022-01-29 NOTE — Telephone Encounter (Signed)
Left second VM

## 2022-01-29 NOTE — Telephone Encounter (Signed)
Left VM

## 2022-01-30 NOTE — Telephone Encounter (Signed)
Called Mr. Busk, he is driving, will call him back on 7/26 at 12:30pm.

## 2022-01-31 NOTE — Telephone Encounter (Signed)
Spoke to father, [place] Levine is doing okay. Father is concerned about the increase in seizures recently. Discussed triggers, dad thinks he is sleeping okay but he is not sure. He may say he takes his medication but they are not sure, dad will ask to check behind him. Also discussed alcohol intake. [place] Levine gets allowance, when he gets a little bit of freedom, he goes out and walk, first thing he wants to do is buy alcohol and tobacco. He sneaks behind their backs, comes home to caregiver. Recently brought a breathalyzer test and it has not been regularly checked.   Discussed Briviact, dose increased to 100mg  BID, would see how he does in the next 3-4 weeks.  He is interested in the , we will fill out the form.

## 2022-01-31 NOTE — Telephone Encounter (Signed)
Left VM

## 2022-02-08 ENCOUNTER — Telehealth: Payer: Self-pay

## 2022-02-08 ENCOUNTER — Telehealth: Payer: Self-pay | Admitting: Neurology

## 2022-02-08 NOTE — Telephone Encounter (Signed)
Left message with after hour service on 02-08-22 at 12:57 pm   Caller states that she is calling about the Embrace 2 for patient seizure. She is going to order it but wants to make sure medicaid will pay for it and she will need a Dr Equities trader

## 2022-02-08 NOTE — Telephone Encounter (Signed)
Called pt care giver back and told her papers were sent out yesterday. If she did not have what she needed to give [place] a call back.. She understood.

## 2022-02-09 NOTE — Telephone Encounter (Signed)
noted 

## 2022-02-13 ENCOUNTER — Telehealth: Payer: Self-pay | Admitting: Neurology

## 2022-02-13 ENCOUNTER — Ambulatory Visit: Payer: Medicare Other | Admitting: Neurology

## 2022-02-13 ENCOUNTER — Other Ambulatory Visit: Payer: Self-pay

## 2022-02-13 ENCOUNTER — Encounter: Payer: Self-pay | Admitting: Neurology

## 2022-02-13 DIAGNOSIS — G40309 Generalized idiopathic epilepsy and epileptic syndromes, not intractable, without status epilepticus: Secondary | ICD-10-CM

## 2022-02-13 DIAGNOSIS — Z5181 Encounter for therapeutic drug level monitoring: Secondary | ICD-10-CM

## 2022-02-13 MED ORDER — BRIVARACETAM 100 MG PO TABS: 100.0000 mg | ORAL_TABLET | Freq: Two times a day (BID) | ORAL | 1 refills | Status: DC

## 2022-02-13 MED ORDER — BRIVARACETAM 100 MG PO TABS: 100.0000 mg | ORAL_TABLET | Freq: Two times a day (BID) | ORAL | 5 refills | Status: DC

## 2022-02-13 NOTE — Telephone Encounter (Signed)
Pls let caregiver know I sent in refills for Briviact 100mg  BID to [facility], thanks

## 2022-02-13 NOTE — Telephone Encounter (Signed)
Called caregiver and let her know

## 2022-02-13 NOTE — Telephone Encounter (Signed)
Patients caretaker called and state Douglas Levine needs a refill for the [place], he uses the [address] on [address] and [address].

## 2022-02-20 ENCOUNTER — Ambulatory Visit: Payer: Medicare Other | Admitting: Neurology

## 2022-04-17 ENCOUNTER — Encounter: Payer: Self-pay | Admitting: Neurology

## 2022-04-17 ENCOUNTER — Ambulatory Visit (INDEPENDENT_AMBULATORY_CARE_PROVIDER_SITE_OTHER): Payer: Medicare Other | Admitting: Neurology

## 2022-04-17 VITALS — BP 106/62 | HR 74 | Ht 73.0 in | Wt 166.0 lb

## 2022-04-17 DIAGNOSIS — G40309 Generalized idiopathic epilepsy and epileptic syndromes, not intractable, without status epilepticus: Secondary | ICD-10-CM

## 2022-04-17 DIAGNOSIS — F419 Anxiety disorder, unspecified: Secondary | ICD-10-CM | POA: Diagnosis not present

## 2022-04-17 MED ORDER — BRIVARACETAM 100 MG PO TABS: 100.0000 mg | ORAL_TABLET | Freq: Two times a day (BID) | ORAL | 3 refills | Status: DC

## 2022-04-17 NOTE — Progress Notes (Signed)
NEUROLOGY FOLLOW UP OFFICE NOTE  Douglas Levine [id number] 1991/05/27  HISTORY OF PRESENT ILLNESS: I had the pleasure of seeing Douglas Levine in follow-up in the neurology clinic on 04/17/2022.  The patient was last seen 4 months ago for epilepsy.  He is again accompanied by his ALF provider [place] who helps supplement the history today.  Records and images were personally reviewed where available.  Since his last visit, he was started on Briviact in addition to Lamotrigine 400mg  BID. He was in the ER on 7/7 for seizure, Briviact increased to 100mg  BID. He was back in the ER on 7/19 for another seizure, he was sitting at the table and screamed out then fell out of the chair, hitting his chin, requiring stitches. At that time, there was some alcohol intake noted. I had spoken to his father who reported that may go out for a walk and buy alcohol and tobacco, they bought a breathalyzer. 8/19 denies any further alcohol use. He did well for 2 months until he had a seizure on 03/30/22. [place] notes he was taking his medications wrong, she fills his pillbox but he got confused and started having a discrepancy counting his medications. He apparently has left over Lamotrigine 100mg  tablets and has been taking 4 tabs BID but counting the 100mg  Briviact, which was confusing.  04/01/22 notes all the seizures have been in the morning while making his coffee getting ready for work. She does note a lot of stress, and that he was stressing prior one of the seizures, then vocalized and went down. They have been waiting to see a counselor. He sleeps well. He denies any headaches, dizziness, focal numbness/tingling/weakness.     History on Initial Assessment 02/15/2021: This is a 31 year old left-handed man with a history of high-functioning autism with borderline intellectual ability, juvenile absence epilepsy, presenting to establish adult epilepsy care. He is accompanied by , his AFL provider since  February 2022, who provides additional information. Records from his pediatric neurologist Dr. 04/17/2021 were reviewed and will be summarizes as follows. He was adopted in 03-15-1991. He was diagnosed with developmental delay and behavior disorder in 1997. He had an EEG in 2007 with episodes of 6-10 seconds of 2-1/2 Hz spike and slow wave activity, he was poorly responsive during this time. He also had frontally predominant isolated spike and slow wave discharges. Per notes, he had an MRI and EEG in [place] before moving to [place]. He was initially on Zonisamide in 2007. He has been on Lamotrigine for many years. He states that the last seizure was in April 2021, longest seizure-free interval is almost 2 years. He very rarely has a warning prior to his seizures, sometimes he feels dizzy. He states most of the time, seizures occur when he is not taking his medications. He manages his own medications and 2008 checks behind, reporting he is very compliant. He has very rare body jerks. He and [place] Levine deny any staring/unresponsive episodes, gaps in time, olfactory/gustatory hallucinations, deja vu, rising epigastric sensation, focal numbness/tingling/weakness. Concern was raised in the past by his father about nocturnal seizures, he denies waking up with tongue bite or incontinence. 2008 has not noticed any episodes concerning for nocturnal seizures although sleeps in a different room. No headaches, dizziness, vision changes, dysarthria/dysphagia, neck/back pain, bowel/bladder dysfunction. Long-term memory is good, short-term is not so good. He does not drive. He hopes to enter the job force, he is done with school. Mood is good. He lives with  Doug[place] Levine and her family (alternative family living), his father manages finances and is his legal guardian.   Epilepsy Risk Factors:  He had poor such and jaundice in the nursery, developmental delay. He was adopted. There is no listed history of febrile convulsions, CNS infections such  as meningitis/encephalitis, significant traumatic brain injury, neurosurgical procedures, or family history of seizures.  Prior AEDs: Zonisamide   PAST MEDICAL HISTORY: Past Medical History:  Diagnosis Date   Alcohol abuse    Autism    Borderline intellectual disability    Drug abuse (HCC)    Other specified pervasive developmental disorders, current or active state    Schizoaffective-type schizophrenia (HCC)    Seizure (HCC)     MEDICATIONS: Current Outpatient Medications on File Prior to Visit  Medication Sig Dispense Refill   brivaracetam (BRIVIACT) 100 MG TABS tablet Take 1 tablet (100 mg total) by mouth 2 (two) times daily. 60 tablet 5   HUMIRA PEN 40 MG/0.8ML PNKT INJECT 1 PEN Q 2 WEEKS  5   lamoTRIgine (LAMICTAL) 200 MG tablet Take 2 tablets (400mg ) twice a day 360 tablet 3   No current facility-administered medications on file prior to visit.    ALLERGIES: No Known Allergies  FAMILY HISTORY: Family History  Problem Relation Age of Onset   Other Mother        Mother was a slow learner was in special education until 6 th grade.   Alcohol abuse Father        atient's father had a history of alcohol and drug abuse and behavior problems.    Drug abuse Father     SOCIAL HISTORY: Social History   Socioeconomic History   Marital status: Single    Spouse name: Not on file   Number of children: Not on file   Years of education: Not on file   Highest education level: Not on file  Occupational History   Not on file  Tobacco Use   Smoking status: Some Days    Packs/day: 0.50    Types: Cigarettes   Smokeless tobacco: Never  Substance and Sexual Activity   Alcohol use: Yes    Alcohol/week: 0.0 standard drinks of alcohol    Comment: A few drinks a week   Drug use: Yes    Types: Cocaine   Sexual activity: Yes    Partners: Female    Birth control/protection: Condom  Other Topics Concern   Not on file  Social History Narrative   Douglas Levine is a 31 yo male.   He  attends [employer].   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   He lives with his AF mom. He has two siblings.   Left handed   Drinks caffeine   Two story home   Social Determinants of Health   Financial Resource Strain: Not on file  Food Insecurity: Not on file  Transportation Needs: Not on file  Physical Activity: Not on file  Stress: Not on file  Social Connections: Not on file  Intimate Partner Violence: Not on file     PHYSICAL EXAM: Vitals:   04/17/22 1400  BP: 106/62  Pulse: 74  SpO2: 99%   General: No acute distress Head:  Normocephalic/atraumatic Skin/Extremities: No rash, no edema Neurological Exam: alert and awake. No aphasia or dysarthria. Fund of knowledge is appropriate. Attention and concentration are normal.   Cranial nerves: Pupils equal, round. Extraocular movements intact with no nystagmus. Visual fields full.  No facial asymmetry.  Motor: Bulk and tone  normal, muscle strength 5/5 throughout with no pronator drift.   Finger to nose testing intact.  Gait narrow-based and steady, able to tandem walk adequately.  Romberg negative.   IMPRESSION: This is a 31 yo LH man with a history of high-functioning autism with borderline intellectual ability, juvenile absence epilepsy, with an increase in seizures this past year. He had previously been seizure-free for over a year, then had an increase in seizures the past year. Briviact 100mg  BID has been added to his regimen, last seizure was 03/30/22 with some question about how he is taking his medications. I advised starting to use pillpacks to help with medication confusion. Continue Lamotrigine 200mg  2 tabs BID and Briviact 100mg  BID. [place] endorses a lot of stress, they both feel he would benefit from seeing a therapist for anxiety/stress management. He does not drive. Follow-up in 4 months, call for any changes.    Thank you for allowing me to participate in his care.  Please do not hesitate to call for any  questions or concerns.   Ellouise Newer, M.D.   CC: Dr. Luciana Axe

## 2022-04-17 NOTE — Patient Instructions (Signed)
Good to see you.  Continue Briviact (Brivaracetam) 100mg : take 1 tablet in AM, 1 tablet in PM  2. Take the Lamictal (Lamotrigine) 200mg : 2 tablets in AM, 2 tablets in PM  3. Please check with your pharmacy if they do pre-packaged pills to help with compliance  4. Referral will be [facility] to [facility] for counseling  5. Follow-up in 4 months, call for any changes   Seizure Precautions: 1. If medication has been prescribed for you to prevent seizures, take it exactly as directed.  Do not stop taking the medicine without talking to your doctor first, even if you have not had a seizure in a long time.   2. Avoid activities in which a seizure would cause danger to yourself or to others.  Don't operate dangerous machinery, swim alone, or climb in high or dangerous places, such as on ladders, roofs, or girders.  Do not drive unless your doctor says you may.  3. If you have any warning that you may have a seizure, lay down in a safe place where you can't hurt yourself.    4.  No driving for 6 months from last seizure, as per [facility].   Please refer to the following link on the [place] website for more information: http://[email or web address]/[email or web address]/driving/drivingu.cfm   5.  Maintain good sleep hygiene. Avoid alcohol.  6.  Contact your doctor if you have any problems that may be related to the medicine you are taking.  7.  Call 911 and bring the patient back to the ED if:        A.  The seizure lasts longer than 5 minutes.       B.  The patient doesn't awaken shortly after the seizure  C.  The patient has new problems such as difficulty seeing, speaking or moving  D.  The patient was injured during the seizure  E.  The patient has a temperature over 102 F (39C)  F.  The patient vomited and now is having trouble breathing

## 2022-05-24 ENCOUNTER — Ambulatory Visit: Payer: Medicare Other | Admitting: Psychologist

## 2022-06-12 ENCOUNTER — Telehealth: Payer: Self-pay | Admitting: Neurology

## 2022-06-12 NOTE — Telephone Encounter (Signed)
Pt's father called in to update the pt's address and I'm sending a new DPR to be filled out. He will be changing caregivers soon.

## 2022-08-15 ENCOUNTER — Other Ambulatory Visit: Payer: Self-pay | Admitting: Neurology

## 2022-08-21 ENCOUNTER — Ambulatory Visit (INDEPENDENT_AMBULATORY_CARE_PROVIDER_SITE_OTHER): Payer: Medicare Other | Admitting: Neurology

## 2022-08-21 ENCOUNTER — Encounter: Payer: Self-pay | Admitting: Neurology

## 2022-08-21 DIAGNOSIS — G40309 Generalized idiopathic epilepsy and epileptic syndromes, not intractable, without status epilepticus: Secondary | ICD-10-CM | POA: Diagnosis not present

## 2022-08-21 MED ORDER — BRIVARACETAM 100 MG PO TABS: 100.0000 mg | ORAL_TABLET | Freq: Two times a day (BID) | ORAL | 3 refills | Status: DC

## 2022-08-21 MED ORDER — LAMOTRIGINE 200 MG PO TABS: ORAL_TABLET | ORAL | 3 refills | Status: DC

## 2022-08-21 NOTE — Patient Instructions (Addendum)
Good to see you doing well.   Continue Lamotrigine 270m: take 2 tablets twice a day  2. Continue Briviact 1056m take 1 tablet twice a day  3. Follow-up in 6 months, call for any changes  Seizure Precautions: 1. If medication has been prescribed for you to prevent seizures, take it exactly as directed.  Do not stop taking the medicine without talking to your doctor first, even if you have not had a seizure in a long time.   2. Avoid activities in which a seizure would cause danger to yourself or to others.  Don't operate dangerous machinery, swim alone, or climb in high or dangerous places, such as on ladders, roofs, or girders.  Do not drive unless your doctor says you may.  3. If you have any warning that you may have a seizure, lay down in a safe place where you can't hurt yourself.    4.  No driving for 6 months from last seizure, [facility]  Please refer to the following link on the Ep[place]. [place]bsite for more information: http://[email or web address].org/[email or web address]/driving/drivingu.cfm   5.  Maintain good sleep hygiene. Minimize alcohol.  6.  Contact your doctor if you have any problems that may be related to the medicine you are taking.  7.  Call 911 and bring the patient back to the ED if:        A.  The seizure lasts longer than 5 minutes.       B.  The patient doesn't awaken shortly after the seizure  C.  The patient has new problems such as difficulty seeing, speaking or moving  D.  The patient was injured during the seizure  E.  The patient has a temperature over 102 F (39C)  F.  The patient vomited and now is having trouble breathing

## 2022-08-21 NOTE — Progress Notes (Signed)
NEUROLOGY FOLLOW UP OFFICE NOTE  Doniven Kamm [id number] 1990-08-01  HISTORY OF PRESENT ILLNESS: I had the pleasure of seeing [place] Levine in follow-up in the neurology clinic on 08/21/2022.  The patient was last seen 4 months ago for epilepsy. He is accompanied by his caregiver Garlon Hatchet who helps supplement the history today.  Records and images were personally reviewed where available.  Since his last visit, he has moved to a different AFL provider, he has been with Garlon Hatchet since December. They deny any seizures since September 2023. Garlon Hatchet administers medications, he continues on Lamotrigine 224m 2 tabs BID (4039mBID) and Briviact 10023mID without side effects. No staring/unresponsive episodes, gaps in time, olfactory/gustatory hallucinations, focal numbness/tingling/weakness, myoclonic jerks. No headaches, dizziness, vision changes, no falls. Sleep is okay. Mood is okay, he reports he is still waiting on a therapist who sees patients with high functioning autism. He reports drinking 4 beers a week (2 beers each on T/Th).    History on Initial Assessment 02/15/2021: This is a 30 55ar old left-handed man with a history of high-functioning autism with borderline intellectual ability, juvenile absence epilepsy, presenting to establish adult epilepsy care. He is accompanied by [place] AFL provider since February 2022, who provides additional information. Records from his pediatric neurologist Dr. HicGaynell Facere reviewed and will be summarizes as follows. He was adopted in 1993. He was diagnosed with developmental delay and behavior disorder in 1997. He had an EEG in 2007 with episodes of 6-10 seconds of 2-1/2 Hz spike and slow wave activity, he was poorly responsive during this time. He also had frontally predominant isolated spike and slow wave discharges. Per [place], [place] was initially on Zonisamide in 2007. He has been on Lamotrigine for many years.  He states that the last seizure was in April 2021, longest seizure-free interval is almost 2 years. He very rarely has a warning prior to his seizures, sometimes he feels dizzy. He states most of the time, seizures occur when he is not taking his medications. He manages his own medications and Alb[place] behind, reporting he is very compliant. He has very rare body jerks. He and [place] any staring/unresponsive episodes, gaps in time, olfactory/gustatory hallucinations, deja vu, rising epigastric sensation, focal numbness/tingling/weakness. Concern was raised in the past by his father about nocturnal seizures, he denies waking up with tongue bite or incontinence. [place] not noticed any episodes concerning for nocturnal seizures although sleeps in a different room. No headaches, dizziness, vision changes, dysarthria/dysphagia, neck/back pain, bowel/bladder dysfunction. Long-term memory is good, short-term is not so good. He does not drive. He hopes to enter the job force, he is done with school. Mood is good. He lives with Alb[place] her family (alternative family living), his father manages finances and is his legal guardian.   Epilepsy Risk Factors:  He had poor such and jaundice in the nursery, developmental delay. He was adopted. There is no listed history of febrile convulsions, CNS infections such as meningitis/encephalitis, significant traumatic brain injury, neurosurgical procedures, or family history of seizures.  Prior AEDs: Zonisamide   PAST MEDICAL HISTORY: Past Medical History:  Diagnosis Date   Alcohol abuse    Autism    Borderline intellectual disability    Drug abuse ([facility]  Other specified pervasive developmental disorders, current or active state    Schizoaffective-type schizophrenia (HCC[place]  Seizure ([place]CC[place]   MEDICATIONS: Current  Outpatient Medications on File Prior to Visit  Medication Sig Dispense Refill   brivaracetam (BRIVIACT) 100 MG TABS tablet Take 1  tablet (100 mg total) by mouth 2 (two) times daily. 180 tablet 3   HUMIRA PEN 40 MG/0.8ML PNKT INJECT 1 PEN Q 2 WEEKS  5   lamoTRIgine (LAMICTAL) 200 MG tablet TAKE 2 TABLET BY MOUTH TWICE DAILY 120 tablet 0   No current facility-administered medications on file prior to visit.    ALLERGIES: No Known Allergies  FAMILY HISTORY: Family History  Problem Relation Age of Onset   Other Mother        Mother was a slow learner was in special education until 6 th grade.   Alcohol abuse Father        atient's father had a history of alcohol and drug abuse and behavior problems.    Drug abuse Father     SOCIAL HISTORY: Social History   Socioeconomic History   Marital status: Single    Spouse name: Not on file   Number of children: Not on file   Years of education: Not on file   Highest education level: Not on file  Occupational History   Not on file  Tobacco Use   Smoking status: Some Days    Packs/day: 0.50    Types: Cigarettes   Smokeless tobacco: Never  Vaping Use   Vaping Use: Never used  Substance and Sexual Activity   Alcohol use: Yes    Alcohol/week: 0.0 standard drinks of alcohol    Comment: A few drinks a week   Drug use: Not Currently    Types: Cocaine   Sexual activity: Yes    Partners: Female    Birth control/protection: Condom  Other Topics Concern   Not on file  Social History Narrative   Doug[place] is a 32 yo male.   He attends [employer].   He is Chemical engineer in Office manager.   He lives with his AF mom. He has two siblings.   Left handed   Drinks caffeine   Two story home   Social Determinants of Health   Financial Resource Strain: Not on file  Food Insecurity: Not on file  Transportation Needs: Not on file  Physical Activity: Not on file  Stress: Not on file  Social Connections: Not on file  Intimate Partner Violence: Not on file     PHYSICAL EXAM: Vitals:   08/21/22 1124  BP: (!) 105/56  Pulse: 80  SpO2: 99%   General: No  acute distress Head:  Normocephalic/atraumatic Skin/Extremities: No rash, no edema Neurological Exam: alert and awake. No aphasia or dysarthria. Fund of knowledge is appropriate. Attention and concentration are normal.   Cranial nerves: Pupils equal, round. Extraocular movements intact with no nystagmus. Visual fields full.  No facial asymmetry.  Motor: Bulk and tone normal, muscle strength 5/5 throughout with no pronator drift.   Finger to nose testing intact.  Gait narrow-based and steady, able to tandem walk adequately.  Romberg negative.   IMPRESSION: This is a 32 yo LH man with a history of high-functioning autism with borderline intellectual ability and primary generalized epilepsy. He had an increase in seizures in 2022-2023, Briviact was added to his regimen, he has been seizure-free since 03/2022 on Lamotrigine 45m BID (2072m2 tabs BID) and Briviact 10066mID, refills sent. We discussed avoidance of seizure triggers. Continue close supervision.He does not drive. Follow-up in 6 months, call for any changes.    Thank you for  allowing me to participate in his care.  Please do not hesitate to call for any questions or concerns.    Ellouise Newer, M.D.   CC: Dr. Luciana Axe

## 2022-09-18 ENCOUNTER — Ambulatory Visit: Payer: Medicare Other | Admitting: Neurology

## 2022-11-21 ENCOUNTER — Other Ambulatory Visit: Payer: Self-pay

## 2022-11-21 DIAGNOSIS — G40309 Generalized idiopathic epilepsy and epileptic syndromes, not intractable, without status epilepticus: Secondary | ICD-10-CM

## 2022-11-21 MED ORDER — BRIVARACETAM 100 MG PO TABS: 100.0000 mg | ORAL_TABLET | Freq: Two times a day (BID) | ORAL | 3 refills | Status: DC

## 2023-02-26 ENCOUNTER — Encounter: Payer: Self-pay | Admitting: Neurology

## 2023-02-26 ENCOUNTER — Ambulatory Visit (INDEPENDENT_AMBULATORY_CARE_PROVIDER_SITE_OTHER): Payer: Medicare Other | Admitting: Neurology

## 2023-02-26 VITALS — BP 103/58 | HR 78 | Ht 74.0 in | Wt 167.0 lb

## 2023-02-26 DIAGNOSIS — G40309 Generalized idiopathic epilepsy and epileptic syndromes, not intractable, without status epilepticus: Secondary | ICD-10-CM

## 2023-02-26 MED ORDER — BRIVARACETAM 100 MG PO TABS: 100.0000 mg | ORAL_TABLET | Freq: Two times a day (BID) | ORAL | 3 refills | Status: DC

## 2023-02-26 MED ORDER — LAMOTRIGINE 200 MG PO TABS: ORAL_TABLET | ORAL | 3 refills | Status: DC

## 2023-02-26 NOTE — Progress Notes (Signed)
NEUROLOGY FOLLOW UP OFFICE NOTE  Douglas Levine [id number] 22-Dec-1990  HISTORY OF PRESENT ILLNESS: I had the pleasure of seeing Douglas Levine in follow-up in the neurology clinic on 02/26/2023.  The patient was last seen 6 months ago for epilepsy. He is accompanied by his father who helps supplement the history today.  He moved back in with his father last February. Records and images were personally reviewed where available.  Since his last visit, his father reports the last seizure in 05/2022. He is doing well on Briviact 100mg  BID and Lamotrigine 200mg  2 tabs BID (400mg  BID). His father reports that changing his morning routine has helped a lot, he takes his morning medications, lies back in bed, then would go about daily activities. They deny any staring/unresponsive episodes, gaps in time, olfactory/gustatory hallucinations, focal numbness/tingling/weakness, myoclonic jerks. No headaches, dizziness, vision changes, no falls. He sleeps okay, reporting 8 hours sleep. Mood is good, his father notes he has ups and downs. He needs reminders to take his medications, his father watches him take them. He works 3 days a week for 5 hours a day. On Tues and Thurs he is at home, he watches the news. He says appetite is good, his father feels he can eat more. He denies alcohol use. He smokes one cigar a day.    History on Initial Assessment 02/15/2021: This is a 32 year old left-handed man with a history of high-functioning autism with borderline intellectual ability, juvenile absence epilepsy, presenting to establish adult epilepsy care. He is accompanied by Douglas Levine, his AFL provider since February 2022, who provides additional information. Records from his pediatric neurologist Dr. [name] were reviewed and will be summarizes as follows. He was adopted in 1993. He was diagnosed with developmental delay and behavior disorder in 1997. He had an EEG in 2007 with episodes of 6-10 seconds of 2-1/2 Hz spike and slow  wave activity, he was poorly responsive during this time. He also had frontally predominant isolated spike and slow wave discharges. Per notes, he had an MRI and EEG in [place] before moving to [place]. He was initially on Zonisamide in 2007. He has been on Lamotrigine for many years. He states that the last seizure was in April 2021, longest seizure-free interval is almost 2 years. He very rarely has a warning prior to his seizures, sometimes he feels dizzy. He states most of the time, seizures occur when he is not taking his medications. He manages his own medications and Douglas Levine checks behind, reporting he is very compliant. He has very rare body jerks. He and Douglas Levine deny any staring/unresponsive episodes, gaps in time, olfactory/gustatory hallucinations, deja vu, rising epigastric sensation, focal numbness/tingling/weakness. Concern was raised in the past by his father about nocturnal seizures, he denies waking up with tongue bite or incontinence. Douglas Levine has not noticed any episodes concerning for nocturnal seizures although sleeps in a different room. No headaches, dizziness, vision changes, dysarthria/dysphagia, neck/back pain, bowel/bladder dysfunction. Long-term memory is good, short-term is not so good. He does not drive. He hopes to enter the job force, he is done with school. Mood is good. He lives with Douglas Levine and her family (alternative family living), his father manages finances and is his legal guardian.   Epilepsy Risk Factors:  He had poor such and jaundice in the nursery, developmental delay. He was adopted. There is no listed history of febrile convulsions, CNS infections such as meningitis/encephalitis, significant traumatic brain injury, neurosurgical procedures, or family history of seizures.  Prior AEDs:  Zonisamide   PAST MEDICAL HISTORY: Past Medical History:  Diagnosis Date   Alcohol abuse    Autism    Borderline intellectual disability    Drug abuse (HCC)    Other specified pervasive  developmental disorders, current or active state    Schizoaffective-type schizophrenia (HCC)    Seizure (HCC)     MEDICATIONS: Current Outpatient Medications on File Prior to Visit  Medication Sig Dispense Refill   brivaracetam (BRIVIACT) 100 MG TABS tablet Take 1 tablet (100 mg total) by mouth 2 (two) times daily. 180 tablet 3   HUMIRA PEN 40 MG/0.8ML PNKT INJECT 1 PEN Q 2 WEEKS  5   lamoTRIgine (LAMICTAL) 200 MG tablet TAKE 2 TABLET BY MOUTH TWICE DAILY 360 tablet 3   No current facility-administered medications on file prior to visit.    ALLERGIES: No Known Allergies  FAMILY HISTORY: Family History  Problem Relation Age of Onset   Other Mother        Mother was a slow learner was in special education until 6 th grade.   Alcohol abuse Father        atient's father had a history of alcohol and drug abuse and behavior problems.    Drug abuse Father     SOCIAL HISTORY: Social History   Socioeconomic History   Marital status: Single    Spouse name: Not on file   Number of children: Not on file   Years of education: Not on file   Highest education level: Not on file  Occupational History   Not on file  Tobacco Use   Smoking status: Some Days    Current packs/day: 0.50    Types: Cigarettes   Smokeless tobacco: Never  Vaping Use   Vaping status: Never Used  Substance and Sexual Activity   Alcohol use: Yes    Alcohol/week: 0.0 standard drinks of alcohol    Comment: A few drinks a week   Drug use: Not Currently    Types: Cocaine   Sexual activity: Yes    Partners: Female    Birth control/protection: Condom  Other Topics Concern   Not on file  Social History Narrative   Douglas Levine is a 32 yo male.   He attends [name].   He is Glass blower/designer in Geologist, engineering.   He lives with his AF mom. He has two siblings.   Left handed   Drinks caffeine   Two story home   Social Determinants of Health   Financial Resource Strain: Not on file  Food Insecurity: Not  on file  Transportation Needs: Not on file  Physical Activity: Not on file  Stress: Not on file  Social Connections: Unknown (11/08/2021)   Received from [facility], [facility]   Social Network    Social Network: Not on file  Intimate Partner Violence: Unknown (10/11/2021)   Received from [facility], [facility]   HITS    Physically Hurt: Not on file    Insult or Talk Down To: Not on file    Threaten Physical Harm: Not on file    Scream or Curse: Not on file     PHYSICAL EXAM: Vitals:   02/26/23 1132  BP: (!) 103/58  Pulse: 78  SpO2: 99%   General: No acute distress Head:  Normocephalic/atraumatic Skin/Extremities: No rash, no edema Neurological Exam: alert and awake. No aphasia or dysarthria. Fund of knowledge is appropriate.  Attention and concentration are normal.   Cranial nerves: Pupils equal, round. Extraocular movements  intact with no nystagmus. Visual fields full.  No facial asymmetry.  Motor: Bulk and tone normal, muscle strength 5/5 throughout with no pronator drift.   Finger to nose testing intact.  Gait narrow-based and steady, able to tandem walk adequately.  Romberg negative.   IMPRESSION: This is a 32 yo LH man with a history of high-functioning autism with borderline intellectual ability and primary generalized epilepsy. He had an increase in seizures in 2022-2023, Briviact was added to his regimen, he has been doing well seizure-free since 05/2022 on Lamotrigine 400mg  BID (200mg  2 tabs BID) and Briviact 100mg  BID, refills sent. He does not drive. Follow-up in 6 months, call for any changes.   Thank you for allowing me to participate in his care.  Please do not hesitate to call for any questions or concerns.    Patrcia Dolly, M.D.   CC: Dr. Leavy Cella

## 2023-02-26 NOTE — Patient Instructions (Signed)
Good to see you doing well. Continue all your medications, refills have been sent. Follow-up in 6 months, call for any changes.    Seizure Precautions: 1. If medication has been prescribed for you to prevent seizures, take it exactly as directed.  Do not stop taking the medicine without talking to your doctor first, even if you have not had a seizure in a long time.   2. Avoid activities in which a seizure would cause danger to yourself or to others.  Don't operate dangerous machinery, swim alone, or climb in high or dangerous places, such as on ladders, roofs, or girders.  Do not drive unless your doctor says you may.  3. If you have any warning that you may have a seizure, lay down in a safe place where you can't hurt yourself.    4.  No driving for 6 months from last seizure, as per [facility].   Please refer to the following link on the Epilepsy Foundation of America's website for more information: http://www.epilepsyfoundation.org/answerplace/Social/driving/drivingu.cfm   5.  Maintain good sleep hygiene. Avoid alcohol.  6.  Contact your doctor if you have any problems that may be related to the medicine you are taking.  7.  Call 911 and bring the patient back to the ED if:        A.  The seizure lasts longer than 5 minutes.       B.  The patient doesn't awaken shortly after the seizure  C.  The patient has new problems such as difficulty seeing, speaking or moving  D.  The patient was injured during the seizure  E.  The patient has a temperature over 102 F (39C)  F.  The patient vomited and now is having trouble breathing

## 2023-08-28 ENCOUNTER — Other Ambulatory Visit: Payer: Self-pay | Admitting: Neurology

## 2023-08-28 DIAGNOSIS — G40309 Generalized idiopathic epilepsy and epileptic syndromes, not intractable, without status epilepticus: Secondary | ICD-10-CM

## 2023-08-28 MED ORDER — BRIVARACETAM 100 MG PO TABS
100.0000 mg | ORAL_TABLET | Freq: Two times a day (BID) | ORAL | 3 refills | Status: DC
Start: 2023-08-28 — End: 2023-09-17

## 2023-08-28 NOTE — Telephone Encounter (Signed)
 Pts father called, states his sons brivaracetam 100mg  is very slow coming from mail order. He is out of his medication and needs 6 days worth. He wants to know if aquino will send in a partial RX so he will  have it until the mail order comes.  [facility] [address] [place]

## 2023-09-17 ENCOUNTER — Encounter: Payer: Self-pay | Admitting: Neurology

## 2023-09-17 ENCOUNTER — Ambulatory Visit (INDEPENDENT_AMBULATORY_CARE_PROVIDER_SITE_OTHER): Payer: Medicare Other | Admitting: Neurology

## 2023-09-17 DIAGNOSIS — G40309 Generalized idiopathic epilepsy and epileptic syndromes, not intractable, without status epilepticus: Secondary | ICD-10-CM | POA: Diagnosis not present

## 2023-09-17 MED ORDER — LAMOTRIGINE 200 MG PO TABS: ORAL_TABLET | ORAL | 3 refills | Status: DC

## 2023-09-17 MED ORDER — BRIVARACETAM 100 MG PO TABS
100.0000 mg | ORAL_TABLET | Freq: Two times a day (BID) | ORAL | 3 refills | Status: DC
Start: 2023-09-17 — End: 2024-05-14

## 2023-09-17 NOTE — Patient Instructions (Signed)
Good to see you doing well. Continue all your medications. Follow-up in 8 months, call for any changes.   Seizure Precautions: 1. If medication has been prescribed for you to prevent seizures, take it exactly as directed.  Do not stop taking the medicine without talking to your doctor first, even if you have not had a seizure in a long time.   2. Avoid activities in which a seizure would cause danger to yourself or to others.  Don't operate dangerous machinery, swim alone, or climb in high or dangerous places, such as on ladders, roofs, or girders.  Do not drive unless your doctor says you may.  3. If you have any warning that you may have a seizure, lay down in a safe place where you can't hurt yourself.    4.  No driving for 6 months from last seizure, as per [facility].   Please refer to the following link on the [place] [facility] website for more information: http://www.epilepsyfoundation.org/answerplace/Social/driving/drivingu.cfm   5.  Maintain good sleep hygiene. Avoid alcohol.  6.  Contact your doctor if you have any problems that may be related to the medicine you are taking.  7.  Call 911 and bring the patient back to the ED if:        A.  The seizure lasts longer than 5 minutes.       B.  The patient doesn't awaken shortly after the seizure  C.  The patient has new problems such as difficulty seeing, speaking or moving  D.  The patient was injured during the seizure  E.  The patient has a temperature over 102 F (39C)  F.  The patient vomited and now is having trouble breathing

## 2023-09-17 NOTE — Progress Notes (Signed)
 NEUROLOGY FOLLOW UP OFFICE NOTE  Douglas Levine [id number] 1990-07-31  HISTORY OF PRESENT ILLNESS: I had the pleasure of seeing Douglas Levine in follow-up in the neurology clinic on 09/17/2023.  The patient was last seen 7 months ago for epilepsy. He is again accompanied by his father who helps supplement the history today.  Records and images were personally reviewed where available.  Since his last visit, he continues to do well seizure-free since 05/2022 on Briviact 100mg  BID and Lamotrigine 400mg  BID (200mg : 2 tabs BID), no side effects. He denies any staring/unresponsive episodes, gaps in time, olfactory/gustatory hallucinations, focal numbness/tingling/weakness, myoclonic jerks. No headaches, dizziness, vision changes, no falls. He gets 8 hours of sleep. Mood is good. He takes his own medications, his father reminds him.    History on Initial Assessment 02/15/2021: This is a 33 year old left-handed man with a history of high-functioning autism with borderline intellectual ability, juvenile absence epilepsy, presenting to establish adult epilepsy care. He is accompanied by [name], his AFL provider since February 2022, who provides additional information. Records from his pediatric neurologist Dr. [name] were reviewed and will be summarizes as follows. He was adopted in 1993. He was diagnosed with developmental delay and behavior disorder in 1997. He had an EEG in 2007 with episodes of 6-10 seconds of 2-1/2 Hz spike and slow wave activity, he was poorly responsive during this time. He also had frontally predominant isolated spike and slow wave discharges. Per notes, he had an MRI and EEG in [place] before moving to [place]. He was initially on Zonisamide in 2007. He has been on Lamotrigine for many years. He states that the last seizure was in April 2021, longest seizure-free interval is almost 2 years. He very rarely has a warning prior to his seizures, sometimes he feels dizzy. He states most of the time,  seizures occur when he is not taking his medications. He manages his own medications and [name] checks behind, reporting he is very compliant. He has very rare body jerks. He and [name] deny any staring/unresponsive episodes, gaps in time, olfactory/gustatory hallucinations, deja vu, rising epigastric sensation, focal numbness/tingling/weakness. Concern was raised in the past by his father about nocturnal seizures, he denies waking up with tongue bite or incontinence. [name] has not noticed any episodes concerning for nocturnal seizures although sleeps in a different room. No headaches, dizziness, vision changes, dysarthria/dysphagia, neck/back pain, bowel/bladder dysfunction. Long-term memory is good, short-term is not so good. He does not drive. He hopes to enter the job force, he is done with school. Mood is good. He lives with [name] and her family (alternative family living), his father manages finances and is his legal guardian.   Epilepsy Risk Factors:  He had poor such and jaundice in the nursery, developmental delay. He was adopted. There is no listed history of febrile convulsions, CNS infections such as meningitis/encephalitis, significant traumatic brain injury, neurosurgical procedures, or family history of seizures.  Prior AEDs: Zonisamide   PAST MEDICAL HISTORY: Past Medical History:  Diagnosis Date   Alcohol abuse    Autism    Borderline intellectual disability    Drug abuse (HCC)    Other specified pervasive developmental disorders, current or active state    Schizoaffective-type schizophrenia (HCC)    Seizure (HCC)     MEDICATIONS: Current Outpatient Medications on File Prior to Visit  Medication Sig Dispense Refill   brivaracetam (BRIVIACT) 100 MG TABS tablet Take 1 tablet (100 mg total) by mouth 2 (two) times daily. 180  tablet 3   HUMIRA PEN 40 MG/0.8ML PNKT INJECT 1 PEN Q 2 WEEKS  5   lamoTRIgine (LAMICTAL) 200 MG tablet TAKE 2 TABLET BY MOUTH TWICE DAILY 360 tablet 3    No current facility-administered medications on file prior to visit.    ALLERGIES: No Known Allergies  FAMILY HISTORY: Family History  Problem Relation Age of Onset   Other Mother        Mother was a slow learner was in special education until 6 th grade.   Alcohol abuse Father        atient's father had a history of alcohol and drug abuse and behavior problems.    Drug abuse Father     SOCIAL HISTORY: Social History   Socioeconomic History   Marital status: Single    Spouse name: Not on file   Number of children: Not on file   Years of education: Not on file   Highest education level: Not on file  Occupational History   Not on file  Tobacco Use   Smoking status: Some Days    Current packs/day: 0.50    Types: Cigarettes   Smokeless tobacco: Never  Vaping Use   Vaping status: Never Used  Substance and Sexual Activity   Alcohol use: Yes    Alcohol/week: 0.0 standard drinks of alcohol    Comment: A few drinks a week   Drug use: Not Currently    Types: Cocaine   Sexual activity: Yes    Partners: Female    Birth control/protection: Condom  Other Topics Concern   Not on file  Social History Narrative   Ariyon is a 33 yo male.   He attends [name].   He is Glass blower/designer in Geologist, engineering.   He lives with his AF mom. He has two siblings.   Left handed   Drinks caffeine   Two story home   Social Drivers of Health   Financial Resource Strain: Not on file  Food Insecurity: Not on file  Transportation Needs: Not on file  Physical Activity: Not on file  Stress: Not on file  Social Connections: Unknown (11/08/2021)   Received from [facility], [facility]   Social Network    Social Network: Not on file  Intimate Partner Violence: Unknown (10/11/2021)   Received from [facility], [facility]   HITS    Physically Hurt: Not on file    Insult or Talk Down To: Not on file    Threaten Physical Harm: Not on file    Scream or Curse: Not on file      PHYSICAL EXAM: Vitals:   09/17/23 1426  BP: 113/65  Pulse: 70  SpO2: 99%   General: No acute distress Head:  Normocephalic/atraumatic Skin/Extremities: No rash, no edema Neurological Exam: alert and awake. No aphasia or dysarthria. Fund of knowledge is appropriate.  Attention and concentration are normal.   Cranial nerves: Pupils equal, round. Extraocular movements intact with no nystagmus. Visual fields full.  No facial asymmetry.  Motor: Bulk and tone normal, muscle strength 5/5 throughout with no pronator drift.   Finger to nose testing intact.  Gait narrow-based and steady, able to tandem walk adequately.  Romberg negative.   IMPRESSION: This is a 33 yo LH man with a history of high-functioning autism with borderline intellectual ability and primary generalized epilepsy. He had an increase in seizures in 2022-2023, Briviact was added to his regimen and he has been doing well seizure-free since 05/2022 on Briviact 100mg   BID and Lamotrigine 400mg  BID (200mg  2 tabs BID), refills sent. He does not drive. We discussed avoidance of seizure triggers, including missing medications, sleep deprivation, alcohol. Follow-up in 8 months, call for any changes.   Thank you for allowing me to participate in his care.  Please do not hesitate to call for any questions or concerns.    Patrcia Dolly, M.D.   CC: Dr. Leavy Cella

## 2024-02-27 ENCOUNTER — Telehealth: Payer: Self-pay | Admitting: Neurology

## 2024-02-27 NOTE — Telephone Encounter (Signed)
 Pt. Father called in this afternoon and he stated that his son, (patient) is having problems with the prescription  medication he was prescribe. The problem is that the medication is mail order and the medication has been lost and pt is running out of the medication and he has about 10 more days of medication at this time. Father wants to know if his son  can get another prescription before pt runs out of medication. Thanks

## 2024-02-27 NOTE — Telephone Encounter (Signed)
 Spoke to [facility] verified  brivaracetam  (BRIVIACT ) 100 MG TABS tablet have not arrive, but have another 2nd order for August 24. Kisha recommended for [facility]. Pt father stated have 10 days medication left and it take 10 days for shipping and agreed to [facility] and already talk to [employer].

## 2024-02-27 NOTE — Telephone Encounter (Signed)
 Left a message with the after hour service on 02-27-24 at 12:31 pm   Caller states that the patient medication got lost in the mail and wants to know how they can get more medication due to it being lost in the mail

## 2024-02-28 NOTE — Telephone Encounter (Signed)
 Can let dad know that we have samples for Briviact  in our office if medication does not come before her runs out. Thanks

## 2024-02-28 NOTE — Telephone Encounter (Signed)
 Spoke to pt dad stated pharmacy next ship on the 8/24 and requested for expedite the shipping. Pt dad voiced understanding if medication does not come on time will reach out for sample.

## 2024-03-05 ENCOUNTER — Telehealth: Payer: Self-pay

## 2024-03-05 NOTE — Telephone Encounter (Signed)
 Requesting samples of brivaracetam  (BRIVIACT ) 100 MG TABS tablet [[id number]] to come pick up at 10:00am if you don't mind can you get a few out of the sample closet for me to sign out. Thank you

## 2024-03-05 NOTE — Telephone Encounter (Signed)
 Ok, thanks.

## 2024-05-13 ENCOUNTER — Telehealth: Payer: Self-pay | Admitting: Neurology

## 2024-05-13 DIAGNOSIS — G40309 Generalized idiopathic epilepsy and epileptic syndromes, not intractable, without status epilepticus: Secondary | ICD-10-CM

## 2024-05-13 NOTE — Telephone Encounter (Signed)
 Pt father called in to see if nick can get a 3 month supply on all his medications. He got a letter stating after the 1st of the year he was told they can only ship 3 months worth, not month to month[facility]. He would like a call back

## 2024-05-14 MED ORDER — LAMOTRIGINE 200 MG PO TABS: ORAL_TABLET | ORAL | 4 refills | Status: DC

## 2024-05-14 MED ORDER — BRIVARACETAM 100 MG PO TABS: 100.0000 mg | ORAL_TABLET | Freq: Two times a day (BID) | ORAL | 4 refills | Status: DC

## 2024-05-14 NOTE — Telephone Encounter (Signed)
 Spoke with pt father informed him that Dr Georjean has been writing his prescriptions as a 30-month supply but pharmacy has only been filling 53-month at a time. Dr Georjean sent in another 23-month supply Rx for both [facility] seizure [facility]. Pt father said he would call the pharmacy

## 2024-05-14 NOTE — Telephone Encounter (Signed)
 Pls let him know that I have been writing his prescriptions as a 41-month supply but pharmacy has only been filling 30-month at a time. I sent in another 16-month supply [facility] both [facility]. Thanks

## 2024-05-15 ENCOUNTER — Other Ambulatory Visit: Payer: Self-pay | Admitting: Neurology

## 2024-05-15 DIAGNOSIS — G40309 Generalized idiopathic epilepsy and epileptic syndromes, not intractable, without status epilepticus: Secondary | ICD-10-CM

## 2024-05-15 NOTE — Telephone Encounter (Signed)
 Pt's father wants his son prescriptions called:  brivaracetam  (BRIVIACT ) 100 MG TABS tablet   to start going to [address] [place] [address].

## 2024-05-15 NOTE — Telephone Encounter (Signed)
Needs to go to [facility]

## 2024-05-16 MED ORDER — BRIVARACETAM 100 MG PO TABS: 100.0000 mg | ORAL_TABLET | Freq: Two times a day (BID) | ORAL | 4 refills | Status: DC

## 2024-05-16 NOTE — Telephone Encounter (Signed)
 Done, thanks

## 2024-05-19 ENCOUNTER — Encounter: Payer: Self-pay | Admitting: Neurology

## 2024-05-19 ENCOUNTER — Ambulatory Visit (INDEPENDENT_AMBULATORY_CARE_PROVIDER_SITE_OTHER): Admitting: Neurology

## 2024-05-19 VITALS — BP 106/66 | HR 72 | Ht 74.0 in | Wt 162.2 lb

## 2024-05-19 DIAGNOSIS — G40309 Generalized idiopathic epilepsy and epileptic syndromes, not intractable, without status epilepticus: Secondary | ICD-10-CM | POA: Diagnosis not present

## 2024-05-19 NOTE — Progress Notes (Unsigned)
 NEUROLOGY FOLLOW UP OFFICE NOTE  Douglas Levine [id number] 22-May-1991  HISTORY OF PRESENT ILLNESS: I had the pleasure of seeing Douglas Levine in follow-up in the neurology clinic on 05/19/2024.  The patient was last seen 8 months ago for epilepsy. He is accompanied by his caregiver Princella, his father is on speakerphone to provide additional information today.  Records and images were personally reviewed where available.  He had been seizure-free for almost 2 years until 2 seizures in the past 4-5 months. The first one 4-5 months ago was while playing an intense video game. He was sitting and did not injure himself. The last seizure was 1.5 weeks ago, they were traveling and busy, his father thinks he may have missed a dose of medications. His father administers his medications. He is on Briviact  100mg  BID and Lamotrigine  400mg  BID (200mg  2 tabs BID) without side effects. He denies any prior warning. He injured himself with the recent seizure, falling on the floor, sustaining a cut above his brow, he bit his tongue and cut his lip. No incontinence. He felt really tired. He denies any sleep deprivation, he gets an average of 7 hours. He denies any staring/unresponsive episodes, gaps in time, olfactory/gustatory hallucinations, focal numbness/tingling/weakness, myoclonic jerks. No headaches, dizziness, vision changes, no falls. Mood is good.   History on Initial Assessment 02/15/2021: This is a 33 year old left-handed man with a history of high-functioning autism with borderline intellectual ability, juvenile absence epilepsy, presenting to establish adult epilepsy care. He is accompanied by Alberta, his [facility] provider since February 2022, who provides additional information. Records from his pediatric neurologist Dr. Susen were reviewed and will be summarizes as follows. He was adopted in 1993. He was diagnosed with developmental delay and behavior disorder in 1997. He had an EEG in 2007 with episodes of 6-10  seconds of 2-1/2 Hz spike and slow wave activity, he was poorly responsive during this time. He also had frontally predominant isolated spike and slow wave discharges. Per notes, he had an MRI and EEG in [place]  before moving to [place]. He was initially on Zonisamide in 2007. He has been on Lamotrigine  for many years. He states that the last seizure was in April 2021, longest seizure-free interval is almost 2 years. He very rarely has a warning prior to his seizures, sometimes he feels dizzy. He states most of the time, seizures occur when he is not taking his medications. He manages his own medications and Alberta checks behind, reporting he is very compliant. He has very rare body jerks. He and Alberta deny any staring/unresponsive episodes, gaps in time, olfactory/gustatory hallucinations, deja vu, rising epigastric sensation, focal numbness/tingling/weakness. Concern was raised in the past by his father about nocturnal seizures, he denies waking up with tongue bite or incontinence. Alberta has not noticed any episodes concerning for nocturnal seizures although sleeps in a different room. No headaches, dizziness, vision changes, dysarthria/dysphagia, neck/back pain, bowel/bladder dysfunction. Long-term memory is good, short-term is not so good. He does not drive. He hopes to enter the job force, he is done with school. Mood is good. He lives with Alberta and her family (alternative family living), his father manages finances and is his legal guardian.   Epilepsy Risk Factors:  He had poor such and jaundice in the nursery, developmental delay. He was adopted. There is no listed history of febrile convulsions, CNS infections such as meningitis/encephalitis, significant traumatic brain injury, neurosurgical procedures, or family history of seizures.  Prior AEDs: Zonisamide  PAST MEDICAL HISTORY: Past Medical History:  Diagnosis Date   Alcohol abuse    Autism    Borderline intellectual disability    Drug abuse  (HCC)    Other specified pervasive developmental disorders, current or active state    Schizoaffective-type schizophrenia (HCC)    Seizure (HCC)     MEDICATIONS: Current Outpatient Medications on File Prior to Visit  Medication Sig Dispense Refill   brivaracetam  (BRIVIACT ) 100 MG TABS tablet Take 1 tablet (100 mg total) by mouth 2 (two) times daily. 180 tablet 4   HUMIRA PEN 40 MG/0.8ML PNKT INJECT 1 PEN Q 2 WEEKS  5   lamoTRIgine  (LAMICTAL ) 200 MG tablet TAKE 2 TABLET BY MOUTH TWICE DAILY 360 tablet 4   No current facility-administered medications on file prior to visit.    ALLERGIES: No Known Allergies  FAMILY HISTORY: Family History  Problem Relation Age of Onset   Other Mother        Mother was a slow learner was in special education until 6 th grade.   Alcohol abuse Father        atient's father had a history of alcohol and drug abuse and behavior problems.    Drug abuse Father     SOCIAL HISTORY: Social History   Socioeconomic History   Marital status: Single    Spouse name: Not on file   Number of children: Not on file   Years of education: Not on file   Highest education level: Not on file  Occupational History   Not on file  Tobacco Use   Smoking status: Some Days    Current packs/day: 0.50    Types: Cigarettes   Smokeless tobacco: Never  Vaping Use   Vaping status: Never Used  Substance and Sexual Activity   Alcohol use: Yes    Alcohol/week: 0.0 standard drinks of alcohol    Comment: A few drinks a week   Drug use: Not Currently    Types: Cocaine   Sexual activity: Yes    Partners: Female    Birth control/protection: Condom  Other Topics Concern   Not on file  Social History Narrative   Eisa is a 33 yo male.   He attends [name].   He is glass blower/designer in geologist, engineering.   He lives with his AF mom. He has two siblings.   Left handed   Drinks caffeine   Two story home   Social Drivers of Health   Financial Resource Strain: Not  on file  Food Insecurity: Low Risk  (10/17/2023)   Received from [facility]   Hunger Vital Sign    Within the past 12 months, you worried that your food would run out before you got money to buy more: Never true    Within the past 12 months, the food you bought just didn't last and you didn't have money to get more. : Never true  Transportation Needs: No Transportation Needs (10/17/2023)   Received from [facility]    In the past 12 months, has lack of reliable transportation kept you from medical appointments, meetings, work or from getting things needed for daily living? : No  Physical Activity: Not on file  Stress: Not on file  Social Connections: Unknown (11/08/2021)   [facility]   Social Network    Social Network: Not on file  Intimate Partner Violence: Unknown (10/11/2021)   Received from [facility]   HITS    Physically Hurt:  Not on file    Insult or Talk Down To: Not on file    Threaten Physical Harm: Not on file    Scream or Curse: Not on file     PHYSICAL EXAM: Vitals:   05/19/24 1443  BP: 106/66  Pulse: 72  SpO2: 98%   General: No acute distress Head:  Normocephalic/atraumatic Skin/Extremities: No rash, no edema Neurological Exam: alert and awake. No aphasia or dysarthria. Fund of knowledge is appropriate.  Attention and concentration are normal.   Cranial nerves: Pupils equal, round. Extraocular movements intact with no nystagmus. Visual fields full.  No facial asymmetry.  Motor: Bulk and tone normal, muscle strength 5/5 throughout with no pronator drift.   Finger to nose testing intact.  Gait narrow-based and steady, able to tandem walk adequately.  Romberg negative.   IMPRESSION: This is a 33 yo LH man with a history of high-functioning autism with borderline intellectual ability and primary generalized epilepsy. He had an increase in seizures in 2022-2023 with good response to addition of Briviact . He was almost 2 years  seizure-free until the past 4-5 months with 2 seizures, last was 1.5 weeks ago. We discussed avoidance of seizure triggers. If seizures recur without any triggers, we will plan to increase Briviact . Continue Briviact  100mg  BID and Lamotrigine  400mg  BID for now. He does not drive. Follow-up in 6 months, call for any changes.   Thank you for allowing me to participate in his care.  Please do not hesitate to call for any questions or concerns.    Darice Shivers, M.D.   CC: Dr. Jolee

## 2024-05-19 NOTE — Patient Instructions (Signed)
 Good to see you. Continue all your medications. Let me know if there are continued seizures over the next several months. Follow-up in 6 months, call for any changes.     Seizure Precautions: 1. If medication has been prescribed for you to prevent seizures, take it exactly as directed.  Do not stop taking the medicine without talking to your doctor first, even if you have not had a seizure in a long time.   2. Avoid activities in which a seizure would cause danger to yourself or to others.  Don't operate dangerous machinery, swim alone, or climb in high or dangerous places, such as on ladders, roofs, or girders.  Do not drive unless your doctor says you may.  3. If you have any warning that you may have a seizure, lay down in a safe place where you can't hurt yourself.    4.  No driving for 6 months from last seizure, as per [place]  state law.   Please refer to the following link on the Epile[employer] of [employer]'s website for more information: http://www.epilepsyfoundation.org/answerplace/Social/driving/drivingu.cfm   5.  Maintain good sleep hygiene. Avoid alcohol.  6.  Contact your doctor if you have any problems that may be related to the medicine you are taking.  7.  Call 911 and bring the patient back to the ED if:        A.  The seizure lasts longer than 5 minutes.       B.  The patient doesn't awaken shortly after the seizure  C.  The patient has new problems such as difficulty seeing, speaking or moving  D.  The patient was injured during the seizure  E.  The patient has a temperature over 102 F (39C)  F.  The patient vomited and now is having trouble breathing

## 2024-05-19 NOTE — Progress Notes (Unsigned)
 Has had 2 seizures one while playing video games they stopped the games one was in the morning before he had his morning med and he may had missed his night time of medication dad said that he gives his medication they where busy that night and me is not sure if just missed it, neither seizure required going to the ER.

## 2024-07-29 ENCOUNTER — Telehealth: Payer: Self-pay | Admitting: Neurology

## 2024-07-29 DIAGNOSIS — G40309 Generalized idiopathic epilepsy and epileptic syndromes, not intractable, without status epilepticus: Secondary | ICD-10-CM

## 2024-07-29 NOTE — Telephone Encounter (Signed)
 Pt's father called , he had a Sz Monday 630 am Grand mal in bed. 3 in the last 3-4 months asking for Rx adjustment

## 2024-07-29 NOTE — Telephone Encounter (Signed)
 Pt c/o: seizure Missed medications?  No. Sleep deprived?  No. Alcohol intake?  No. Increased stress? No. Any trigger No he was sleeping Any change in medication color or shape? No. Back to their usual baseline self?  Yes.  . If no, advise go to ER Current medications prescribed by Dr. Georjean:    lamoTRIgine  (LAMICTAL ) 200 MG tablet TAKE 2 TABLET BY MOUTH TWICE DAILY  brivaracetam  (BRIVIACT ) 100 MG TABS tablet Take 1 tablet (100 mg total) by mouth 2 (two) times daily.

## 2024-07-30 ENCOUNTER — Other Ambulatory Visit: Payer: Self-pay

## 2024-07-30 DIAGNOSIS — G40309 Generalized idiopathic epilepsy and epileptic syndromes, not intractable, without status epilepticus: Secondary | ICD-10-CM

## 2024-07-30 MED ORDER — BRIVARACETAM 100 MG PO TABS: ORAL_TABLET | ORAL | 4 refills | Status: AC

## 2024-07-30 NOTE — Telephone Encounter (Signed)
 Pls let dad know I would like to check a Lamictal  level, have bloodwork done first thing in the morning before his AM dose, or closer to end of day before lab closes before he takes his evening dose. I would like to increase the Briviact  to 150mg  twice a day (take 100mg : 1.5 tabs BID). Pls confirm which pharmacy to send, thanks

## 2024-08-06 ENCOUNTER — Other Ambulatory Visit

## 2024-08-12 LAB — LAMOTRIGINE LEVEL: Lamotrigine Lvl: 7.7 ug/mL (ref 2.5–15.0)

## 2024-08-13 ENCOUNTER — Telehealth: Payer: Self-pay | Admitting: Neurology

## 2024-08-13 DIAGNOSIS — G40309 Generalized idiopathic epilepsy and epileptic syndromes, not intractable, without status epilepticus: Secondary | ICD-10-CM

## 2024-08-13 NOTE — Telephone Encounter (Signed)
 Robert(Dad) lvm  in regards to his latest lab work to see what Dr. Georjean is going to do with his Rx lamotrigine , if he has enough room to come up or suggest going higher in his dosage. Please call back regarding results and recommendations.   PH: [phone]

## 2024-08-14 MED ORDER — LAMOTRIGINE 200 MG PO TABS: ORAL_TABLET | ORAL | 4 refills | Status: AC

## 2024-08-14 NOTE — Telephone Encounter (Signed)
 Spoke with pt father informed him that Dr Georjean stated that There is room to increase the Lamotrigine , currently he is taking 2 tablets in AM, 2 tablets in PM. Pls have them increase to 2 tablets in AM, 2 and 1/2 tablets in PM. Recheck level in a week. I  confirm which pharmacy to send updated Rx [facility] script , continue Briviact  lab work was ordred

## 2024-08-14 NOTE — Telephone Encounter (Signed)
 There is room to increase the Lamotrigine , currently he is taking 2 tablets in AM, 2 tablets in PM. Pls have them increase to 2 tablets in AM, 2 and 1/2 tablets in PM. Recheck level in a week. Pls confirm which pharmacy to send updated Rx, continue Briviact . Thanks

## 2024-11-17 ENCOUNTER — Ambulatory Visit: Admitting: Neurology
